# Patient Record
Sex: Female | Born: 1950 | Hispanic: Yes | Marital: Married | Smoking: Never smoker
Health system: Southern US, Community
[De-identification: ages and names within clinical notes are randomized; demographics above are authoritative.]

## PROBLEM LIST (undated history)

## (undated) DIAGNOSIS — E785 Hyperlipidemia, unspecified: Secondary | ICD-10-CM

## (undated) DIAGNOSIS — I8393 Asymptomatic varicose veins of bilateral lower extremities: Secondary | ICD-10-CM

## (undated) DIAGNOSIS — F419 Anxiety disorder, unspecified: Secondary | ICD-10-CM

## (undated) DIAGNOSIS — M8580 Other specified disorders of bone density and structure, unspecified site: Secondary | ICD-10-CM

## (undated) DIAGNOSIS — T7840XA Allergy, unspecified, initial encounter: Secondary | ICD-10-CM

## (undated) HISTORY — DX: Asymptomatic varicose veins of bilateral lower extremities: I83.93

## (undated) HISTORY — DX: Hyperlipidemia, unspecified: E78.5

## (undated) HISTORY — DX: Anxiety disorder, unspecified: F41.9

## (undated) HISTORY — DX: Allergy, unspecified, initial encounter: T78.40XA

## (undated) HISTORY — PX: HERNIA REPAIR: SHX51

## (undated) HISTORY — DX: Other specified disorders of bone density and structure, unspecified site: M85.80

---

## 1978-09-02 HISTORY — PX: INGUINAL HERNIA REPAIR: SUR1180

## 2011-02-22 ENCOUNTER — Emergency Department (HOSPITAL_COMMUNITY)
Admission: EM | Admit: 2011-02-22 | Discharge: 2011-02-23 | Disposition: A | Discharge status: Home / Self Care | Payer: PRIVATE HEALTH INSURANCE | Attending: Emergency Medicine | Admitting: Emergency Medicine

## 2011-02-22 ENCOUNTER — Emergency Department (HOSPITAL_COMMUNITY): Payer: PRIVATE HEALTH INSURANCE

## 2011-02-22 DIAGNOSIS — R109 Unspecified abdominal pain: Secondary | ICD-10-CM | POA: Insufficient documentation

## 2011-02-22 DIAGNOSIS — E039 Hypothyroidism, unspecified: Secondary | ICD-10-CM | POA: Insufficient documentation

## 2011-02-22 DIAGNOSIS — Z79899 Other long term (current) drug therapy: Secondary | ICD-10-CM | POA: Insufficient documentation

## 2011-02-22 DIAGNOSIS — K4090 Unilateral inguinal hernia, without obstruction or gangrene, not specified as recurrent: Secondary | ICD-10-CM | POA: Insufficient documentation

## 2011-02-22 LAB — DIFFERENTIAL
Basophils Absolute: 0 10*3/uL (ref 0.0–0.1)
Eosinophils Absolute: 0.1 10*3/uL (ref 0.0–0.7)
Eosinophils Relative: 1 % (ref 0–5)
Lymphocytes Relative: 29 % (ref 12–46)

## 2011-02-22 LAB — COMPREHENSIVE METABOLIC PANEL
ALT: 39 U/L — ABNORMAL HIGH (ref 0–35)
AST: 25 U/L (ref 0–37)
Albumin: 4.4 g/dL (ref 3.5–5.2)
Alkaline Phosphatase: 63 U/L (ref 39–117)
Chloride: 102 mEq/L (ref 96–112)
Potassium: 4 mEq/L (ref 3.5–5.1)
Sodium: 138 mEq/L (ref 135–145)
Total Bilirubin: 0.3 mg/dL (ref 0.3–1.2)

## 2011-02-22 LAB — CBC
HCT: 39.9 % (ref 36.0–46.0)
Platelets: 253 10*3/uL (ref 150–400)
RDW: 12 % (ref 11.5–15.5)
WBC: 6 10*3/uL (ref 4.0–10.5)

## 2011-02-23 LAB — URINALYSIS, ROUTINE W REFLEX MICROSCOPIC
Glucose, UA: NEGATIVE mg/dL
Hgb urine dipstick: NEGATIVE
Ketones, ur: NEGATIVE mg/dL
Protein, ur: NEGATIVE mg/dL

## 2019-03-31 ENCOUNTER — Other Ambulatory Visit: Payer: Self-pay | Admitting: Physician Assistant

## 2019-03-31 DIAGNOSIS — Z1231 Encounter for screening mammogram for malignant neoplasm of breast: Secondary | ICD-10-CM

## 2019-04-08 ENCOUNTER — Ambulatory Visit
Admission: RE | Admit: 2019-04-08 | Discharge: 2019-04-08 | Disposition: A | Discharge status: Home / Self Care | Payer: PRIVATE HEALTH INSURANCE | Source: Ambulatory Visit | Attending: Physician Assistant | Admitting: Physician Assistant

## 2019-04-08 ENCOUNTER — Other Ambulatory Visit: Payer: Self-pay

## 2019-04-08 DIAGNOSIS — Z1231 Encounter for screening mammogram for malignant neoplasm of breast: Secondary | ICD-10-CM

## 2020-04-07 ENCOUNTER — Other Ambulatory Visit: Payer: Self-pay | Admitting: Physician Assistant

## 2020-04-07 DIAGNOSIS — Z1231 Encounter for screening mammogram for malignant neoplasm of breast: Secondary | ICD-10-CM

## 2020-04-10 ENCOUNTER — Ambulatory Visit
Admission: RE | Admit: 2020-04-10 | Discharge: 2020-04-10 | Disposition: A | Discharge status: Home / Self Care | Payer: PRIVATE HEALTH INSURANCE | Source: Ambulatory Visit | Attending: Physician Assistant | Admitting: Physician Assistant

## 2020-04-10 ENCOUNTER — Other Ambulatory Visit: Payer: Self-pay

## 2020-04-10 DIAGNOSIS — Z1231 Encounter for screening mammogram for malignant neoplasm of breast: Secondary | ICD-10-CM

## 2020-06-28 ENCOUNTER — Ambulatory Visit (HOSPITAL_COMMUNITY)
Admission: RE | Admit: 2020-06-28 | Discharge: 2020-06-28 | Disposition: A | Discharge status: Home / Self Care | Payer: Medicare Other | Source: Ambulatory Visit | Attending: Vascular Surgery | Admitting: Vascular Surgery

## 2020-06-28 ENCOUNTER — Encounter: Payer: Self-pay | Admitting: Vascular Surgery

## 2020-06-28 ENCOUNTER — Other Ambulatory Visit (HOSPITAL_COMMUNITY): Payer: Self-pay | Admitting: Vascular Surgery

## 2020-06-28 ENCOUNTER — Ambulatory Visit (INDEPENDENT_AMBULATORY_CARE_PROVIDER_SITE_OTHER): Payer: Medicare Other | Admitting: Vascular Surgery

## 2020-06-28 ENCOUNTER — Other Ambulatory Visit: Payer: Self-pay

## 2020-06-28 VITALS — Resp 16 | Ht 61.0 in | Wt 150.0 lb

## 2020-06-28 DIAGNOSIS — I8393 Asymptomatic varicose veins of bilateral lower extremities: Secondary | ICD-10-CM

## 2020-06-28 DIAGNOSIS — I83819 Varicose veins of unspecified lower extremities with pain: Secondary | ICD-10-CM | POA: Insufficient documentation

## 2020-06-28 DIAGNOSIS — I83813 Varicose veins of bilateral lower extremities with pain: Secondary | ICD-10-CM | POA: Diagnosis not present

## 2020-06-28 NOTE — Progress Notes (Signed)
Referring Physician: Luciano Cutter  Patient name: Bianca Sanchez MRN: [id number] DOB: Jan 25, 1951 Sex: female  REASON FOR CONSULT: Right leg pain with varicose veins  HPI: Bianca Sanchez is a 69 y.o. female, who had a fairly acute episode of pain in her right leg just below and medial to her right knee.  This did not last very long.  It was approximately 4 months ago.  She has not had any pain since.  She does not really describe swelling of the lower extremities.  She does sometimes develop some fullness and aching at the end of the day.  She has never had a DVT.  She did fracture her right ankle in the past and occasionally gets some swelling around this.  She has no family history of varicose veins.    Past Medical History:  Diagnosis Date  . Anxiety   . Hyperlipemia   . Osteopenia   . Varicose veins of both lower extremities    Past Surgical History:  Procedure Laterality Date  . HERNIA REPAIR      Family History  Problem Relation Age of Onset  . Breast cancer Neg Hx     SOCIAL HISTORY: Social History   Socioeconomic History  . Marital status: Married    Spouse name: Not on file  . Number of children: Not on file  . Years of education: Not on file  . Highest education level: Not on file  Occupational History  . Not on file  Tobacco Use  . Smoking status: Never Smoker  . Smokeless tobacco: Never Used  Substance and Sexual Activity  . Alcohol use: Never  . Drug use: Never  . Sexual activity: Not on file  Other Topics Concern  . Not on file  Social History Narrative  . Not on file   Social Determinants of Health   Financial Resource Strain:   . Difficulty of Paying Living Expenses: Not on file  Food Insecurity:   . Worried About Programme researcher, broadcasting/film/video in the Last Year: Not on file  . Ran Out of Food in the Last Year: Not on file  Transportation Needs:   . Lack of Transportation (Medical): Not on file  . Lack of Transportation (Non-Medical): Not on file   Physical Activity:   . Days of Exercise per Week: Not on file  . Minutes of Exercise per Session: Not on file  Stress:   . Feeling of Stress : Not on file  Social Connections:   . Frequency of Communication with Friends and Family: Not on file  . Frequency of Social Gatherings with Friends and Family: Not on file  . Attends Religious Services: Not on file  . Active Member of Clubs or Organizations: Not on file  . Attends Banker Meetings: Not on file  . Marital Status: Not on file  Intimate Partner Violence:   . Fear of Current or Ex-Partner: Not on file  . Emotionally Abused: Not on file  . Physically Abused: Not on file  . Sexually Abused: Not on file    Allergies  Allergen Reactions  . Ambien [Zolpidem]   . Aspirin   . Penicillins     Current Outpatient Medications  Medication Sig Dispense Refill  . Cyanocobalamin (VITAMIN B12 PO) Take by mouth.     No current facility-administered medications for this visit.    ROS:   General:  No weight loss, Fever, chills  HEENT: No recent headaches, no nasal bleeding, no visual changes,  no sore throat  Neurologic: No dizziness, blackouts, seizures. No recent symptoms of stroke or mini- stroke. No recent episodes of slurred speech, or temporary blindness.  Cardiac: No recent episodes of chest pain/pressure, no shortness of breath at rest.  No shortness of breath with exertion.  Denies history of atrial fibrillation or irregular heartbeat  Vascular: No history of rest pain in feet.  No history of claudication.  No history of non-healing ulcer, No history of DVT   Pulmonary: No home oxygen, no productive cough, no hemoptysis,  No asthma or wheezing  Musculoskeletal:  [ ]  Arthritis, [ ]  Low back pain,  [ ]  Joint pain  Hematologic:No history of hypercoagulable state.  No history of easy bleeding.  No history of anemia  Gastrointestinal: No hematochezia or melena,  No gastroesophageal reflux, no trouble  swallowing  Urinary: [ ]  chronic Kidney disease, [ ]  on HD - [ ]  MWF or [ ]  TTHS, [ ]  Burning with urination, [ ]  Frequent urination, [ ]  Difficulty urinating;   Skin: No rashes  Psychological: No history of anxiety,  No history of depression   Physical Examination  Vitals:   06/28/20 1518  Resp: 16  Weight: 150 lb (68 kg)  Height: 5\' 1"  (1.549 m)    Body mass index is 28.34 kg/m.  General:  Alert and oriented, no acute distress HEENT: Normal Neck: No JVD Cardiac: Regular Rate and Rhythm Skin: No rash, few scattered spider type varicosities bilateral lower extremities primarily medial calf Extremity Pulses:  2+dorsalis pedis, posterior tibial pulses bilaterally Musculoskeletal: No deformity or edema  Neurologic: Upper and lower extremity motor 5/5 and symmetric  DATA: Greater saphenous is less than 3 mm diameter although there is some reflux.  Right greater saphenous is less than 4 mm diameter again some mild reflux.  I reviewed and interpreted the study.  There was no evidence of DVT.  There was mild deep vein reflux.  ASSESSMENT: Patient was episode of pain in the right knee several months ago.  She has not had pain since that time.  She does occasionally have some fullness heaviness and aching in her legs at the end of the day.  She does have mild venous reflux on exam.  Patient was reassured today that she has normal arterial circulation is not at risk of losing toe foot or leg.  Also discussed with her that she does have mild reflux in her lower extremities but that it is not significant enough to consider an intervention at this point.  She was also reassured that she did not have a DVT.   PLAN: Patient was given a prescription today for lower extremity compression stockings 20 to 30 mmHg knee-high.  She will wear these during the day.  She will also elevate her legs at the end of the day.  The patient prefers not to take any anti-inflammatory medications as she has  apparently several drug allergies which cause anaphylaxis.  We will follow up with me on as-needed basis.   , MD [facility] [place] Office: [phone]

## 2020-08-14 ENCOUNTER — Encounter (HOSPITAL_COMMUNITY): Payer: Medicare Other

## 2021-01-31 ENCOUNTER — Ambulatory Visit: Payer: Medicare Other | Admitting: Dermatology

## 2021-02-15 ENCOUNTER — Ambulatory Visit: Payer: Medicare Other | Admitting: Dermatology

## 2021-03-01 ENCOUNTER — Other Ambulatory Visit: Payer: Self-pay | Admitting: Adult Medicine

## 2021-03-01 DIAGNOSIS — M549 Dorsalgia, unspecified: Secondary | ICD-10-CM

## 2021-03-09 ENCOUNTER — Other Ambulatory Visit: Payer: Self-pay | Admitting: Adult Medicine

## 2021-03-09 ENCOUNTER — Other Ambulatory Visit: Payer: Self-pay | Admitting: *Deleted

## 2021-03-09 DIAGNOSIS — Z1231 Encounter for screening mammogram for malignant neoplasm of breast: Secondary | ICD-10-CM

## 2021-03-22 ENCOUNTER — Ambulatory Visit
Admission: RE | Admit: 2021-03-22 | Discharge: 2021-03-22 | Disposition: A | Discharge status: Home / Self Care | Payer: Medicare Other | Source: Ambulatory Visit | Attending: Adult Medicine | Admitting: Adult Medicine

## 2021-03-22 ENCOUNTER — Other Ambulatory Visit: Payer: Self-pay

## 2021-03-22 DIAGNOSIS — M549 Dorsalgia, unspecified: Secondary | ICD-10-CM

## 2021-03-22 DIAGNOSIS — G8929 Other chronic pain: Secondary | ICD-10-CM

## 2021-04-11 ENCOUNTER — Other Ambulatory Visit: Payer: Self-pay

## 2021-04-11 ENCOUNTER — Ambulatory Visit
Admission: RE | Admit: 2021-04-11 | Discharge: 2021-04-11 | Disposition: A | Discharge status: Home / Self Care | Payer: Medicare Other | Source: Ambulatory Visit | Attending: Adult Medicine | Admitting: Adult Medicine

## 2021-04-11 DIAGNOSIS — Z1231 Encounter for screening mammogram for malignant neoplasm of breast: Secondary | ICD-10-CM

## 2021-05-03 ENCOUNTER — Ambulatory Visit: Payer: Medicare Other

## 2022-01-21 ENCOUNTER — Other Ambulatory Visit: Payer: Self-pay | Admitting: Adult Medicine

## 2022-01-21 DIAGNOSIS — Z1231 Encounter for screening mammogram for malignant neoplasm of breast: Secondary | ICD-10-CM

## 2022-04-15 ENCOUNTER — Ambulatory Visit
Admission: RE | Admit: 2022-04-15 | Discharge: 2022-04-15 | Disposition: A | Discharge status: Home / Self Care | Payer: Medicare Other | Source: Ambulatory Visit | Attending: Adult Medicine | Admitting: Adult Medicine

## 2022-04-15 DIAGNOSIS — Z1231 Encounter for screening mammogram for malignant neoplasm of breast: Secondary | ICD-10-CM

## 2022-05-09 ENCOUNTER — Other Ambulatory Visit: Payer: Self-pay | Admitting: *Deleted

## 2022-05-09 DIAGNOSIS — I83813 Varicose veins of bilateral lower extremities with pain: Secondary | ICD-10-CM

## 2022-05-16 ENCOUNTER — Ambulatory Visit (HOSPITAL_COMMUNITY)
Admission: RE | Admit: 2022-05-16 | Discharge: 2022-05-16 | Disposition: A | Discharge status: Home / Self Care | Payer: Medicare Other | Source: Ambulatory Visit | Attending: Vascular Surgery | Admitting: Vascular Surgery

## 2022-05-16 DIAGNOSIS — I83813 Varicose veins of bilateral lower extremities with pain: Secondary | ICD-10-CM | POA: Insufficient documentation

## 2022-05-22 NOTE — Progress Notes (Unsigned)
[facility]           [facility] [place]  History and Physical   Bianca Sanchez is a 71 y.o. female who was seen in 2021 by Dr. Oneida Alar for right leg pain.  She was having occasional fullness and heaviness with aching in her legs at the end of the day.  She had mild venous reflux on exam but not a candidate for laser ablation.  She had normal circulation.  She did not have a DVT.    She comes in today for further evaluation of the left leg.  Translator services on the pad is used.  She states that she has tiredness and achiness that is worse at the end of the day.  She states that her legs feel restless at night and if she elevates them, it does help.  She did try to wear knee high compression after the recommendations at her last visit, but states the top of the compression was too tight and was painful.  She has not tried thigh high compression.  She denies any hx of DVT or venous procedures on her legs.  She does not have any skin changes in the lower legs.   The pt is not on a statin for cholesterol management.  The pt is not on a daily aspirin.   Other AC:  none The pt is not on medication for hypertension.   The pt is not diabetic.   Tobacco hx:  never   Past Medical History:  Diagnosis Date   Anxiety    Hyperlipemia    Osteopenia    Varicose veins of both lower extremities     Past Surgical History:  Procedure Laterality Date   HERNIA REPAIR      Social History   Socioeconomic History   Marital status: Married    Spouse name: Not on file   Number of children: Not on file   Years of education: Not on file   Highest education level: Not on file  Occupational History   Not on file  Tobacco Use   Smoking status: Never   Smokeless tobacco: Never  Substance and Sexual Activity   Alcohol use: Never   Drug use: Never   Sexual activity: Not on file  Other Topics Concern   Not on file  Social History Narrative   Not on file   Social  Determinants of Health   Financial Resource Strain: Not on file  Food Insecurity: Not on file  Transportation Needs: Not on file  Physical Activity: Not on file  Stress: Not on file  Social Connections: Not on file  Intimate Partner Violence: Not on file    Family History  Problem Relation Age of Onset   Breast cancer Neg Hx     Current Outpatient Medications  Medication Sig Dispense Refill   Cyanocobalamin (VITAMIN B12 PO) Take by mouth.     No current facility-administered medications for this visit.    Allergies  Allergen Reactions   Ambien [Zolpidem]    Aspirin    Penicillins     REVIEW OF SYSTEMS:   [X]  denotes positive finding, [ ]  denotes negative finding Cardiac  Comments:  Chest pain or chest pressure:    Shortness of breath upon exertion:    Short of breath when lying flat:    Irregular heart rhythm:        Vascular    Pain in calf, thigh,  or hip brought on by ambulation:    Pain in feet at night that wakes you up from your sleep:     Blood clot in your veins:    Leg swelling:  x See HPI      Pulmonary    Oxygen at home:    Productive cough:     Wheezing:         Neurologic    Sudden weakness in arms or legs:     Sudden numbness in arms or legs:     Sudden onset of difficulty speaking or slurred speech:    Temporary loss of vision in one eye:     Problems with dizziness:         Gastrointestinal    Blood in stool:     Vomited blood:         Genitourinary    Burning when urinating:     Blood in urine:        Psychiatric    Major depression:         Hematologic    Bleeding problems:    Problems with blood clotting too easily:        Skin    Rashes or ulcers:        Constitutional    Fever or chills:      PHYSICAL EXAMINATION:  Today's Vitals   05/23/22 1059  BP: 139/88  Pulse: 75  Resp: 20  Temp: 98.4 F (36.9 C)  TempSrc: Temporal  SpO2: 100%  Weight: 157 lb 12.8 oz (71.6 kg)  Height: 5\' 1"  (1.549 m)  PainSc: 2     Body mass index is 29.82 kg/m.   General:  WDWN in NAD; vital signs documented above Gait: Not observed HENT: WNL, normocephalic Pulmonary: normal non-labored breathing without wheezing Cardiac: regular HR; without carotid bruits Skin: without rashes Vascular Exam/Pulses:  Right Left  Radial 2+ (normal) 2+ (normal)  DP 2+ (normal) 2+ (normal)  PT 2+ (normal) 2+ (normal)   Extremities: minimal swelling BLE; she does have minimal spider veins on the medial aspect of the left lower leg  Neurologic: A&O X 3;  moving all extremities equally Psychiatric:  The pt has Normal affect.   Non-Invasive Vascular Imaging:   Venous duplex on 05/16/2022: +--------------+---------+------+-----------+------------+--------+  LEFT          Reflux NoRefluxReflux TimeDiameter cmsComments                          Yes                                   +--------------+---------+------+-----------+------------+--------+  CFV           no                                              +--------------+---------+------+-----------+------------+--------+  FV mid        no                                              +--------------+---------+------+-----------+------------+--------+  Popliteal     no                                              +--------------+---------+------+-----------+------------+--------+  GSV at [facility]              yes    >500 ms      0.47              +--------------+---------+------+-----------+------------+--------+  GSV prox thighno                            0.20              +--------------+---------+------+-----------+------------+--------+  GSV mid thigh no                            0.26              +--------------+---------+------+-----------+------------+--------+  GSV dist thighno                            0.24              +--------------+---------+------+-----------+------------+--------+  GSV at knee    no                            0.22              +--------------+---------+------+-----------+------------+--------+  SSV Pop Fossa no                            0.17              +--------------+---------+------+-----------+------------+--------+   Summary:  Left:  - No evidence of deep vein thrombosis from the common femoral through the popliteal veins.  - No evidence of superficial venous thrombosis.  - The deep venous system is competent.  - The great and small saphenous veins are competent.    Bianca Sanchez is a 71 y.o. female who presents with: LLE achiness, swelling and heaviness    -pt has easily palpable pedal pulses -pt does not have evidence of DVT.  Pt does have venous reflux in the GSV at the [facility] but no other reflux is present on duplex.  Discussed that the small spider vein on the LLE could be injected but she is not interested in this.   -discussed with pt about wearing knee thigh high compression since she did not tolerate the knee high and she was willing to try this.   -discussed the importance of leg elevation and how to elevate properly - pt is advised to elevate their legs and a diagram is given to them to demonstrate for pt to lay flat on their back with knees elevated and slightly bent with their feet higher than their knees, which puts their feet higher than their heart for 15 minutes per day.  If pt cannot lay flat, advised to lay as flat as possible.  -pt is advised to continue as much walking as possible and avoid sitting or standing for long periods of time.  -discussed importance of weight loss and exercise and that water aerobics would also be beneficial.  -handout in Spanish with recommendations given to pt -pt will f/u as needed.   Doreatha Massed, [facility] [phone]  Clinic MD:  Edilia Bo

## 2022-05-23 ENCOUNTER — Encounter: Payer: Self-pay | Admitting: Physician Assistant

## 2022-05-23 ENCOUNTER — Ambulatory Visit: Payer: Medicare Other | Admitting: Physician Assistant

## 2022-05-23 VITALS — BP 139/88 | HR 75 | Temp 98.4°F | Resp 20 | Ht 61.0 in | Wt 157.8 lb

## 2022-05-23 DIAGNOSIS — M7989 Other specified soft tissue disorders: Secondary | ICD-10-CM

## 2022-07-24 IMAGING — MG MM DIGITAL SCREENING BILAT W/ TOMO AND CAD
8 series · 8 of 24 positions shown · non-contrast
Comparison: Previous exam(s).

CLINICAL DATA: Screening.

EXAM:
DIGITAL SCREENING BILATERAL MAMMOGRAM WITH TOMOSYNTHESIS AND CAD
TECHNIQUE: Bilateral screening digital craniocaudal and mediolateral oblique
mammograms were obtained. Bilateral screening digital breast
tomosynthesis was performed. The images were evaluated with
computer-aided detection.

[R MLO synth-2D]
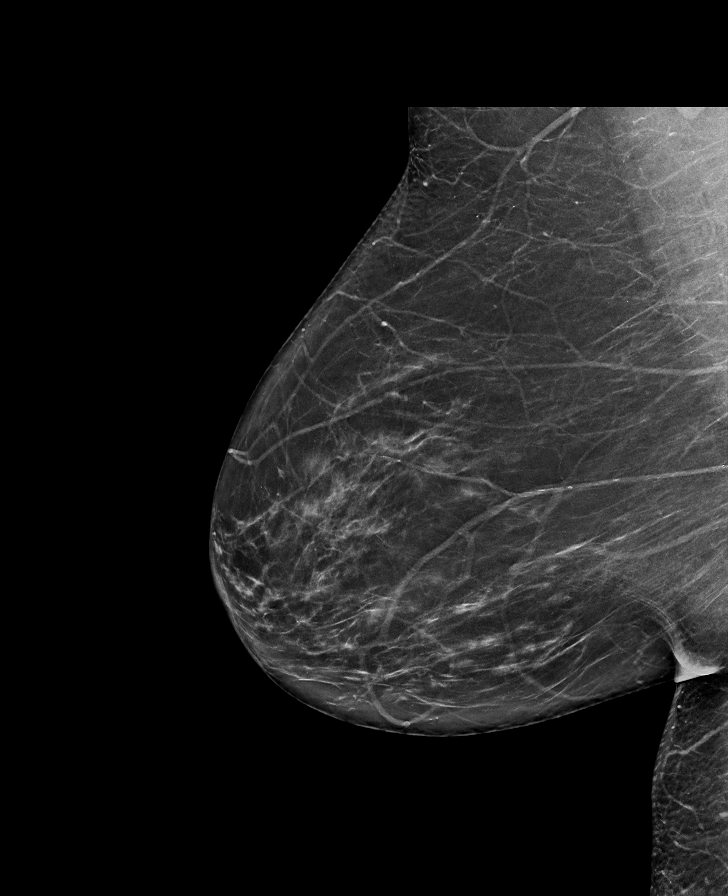

[L CC synth-2D]
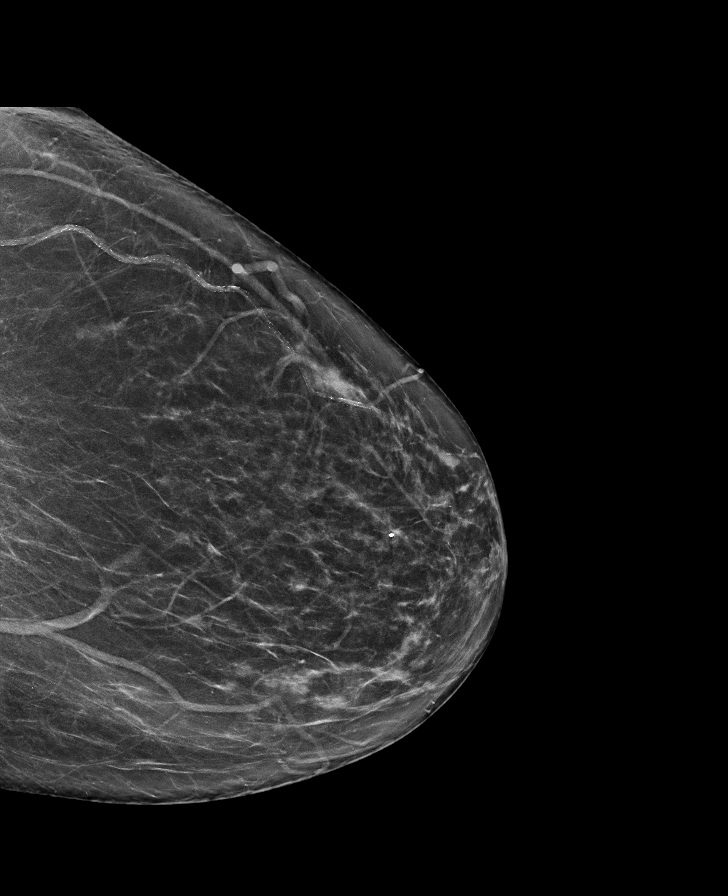

[R CC synth-2D]
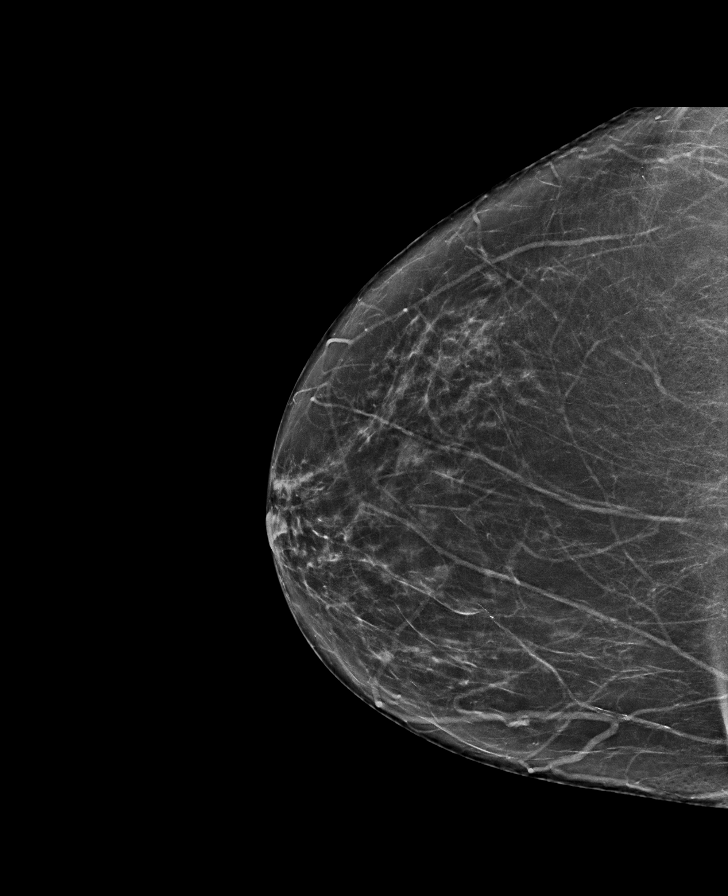

[L MLO synth-2D]
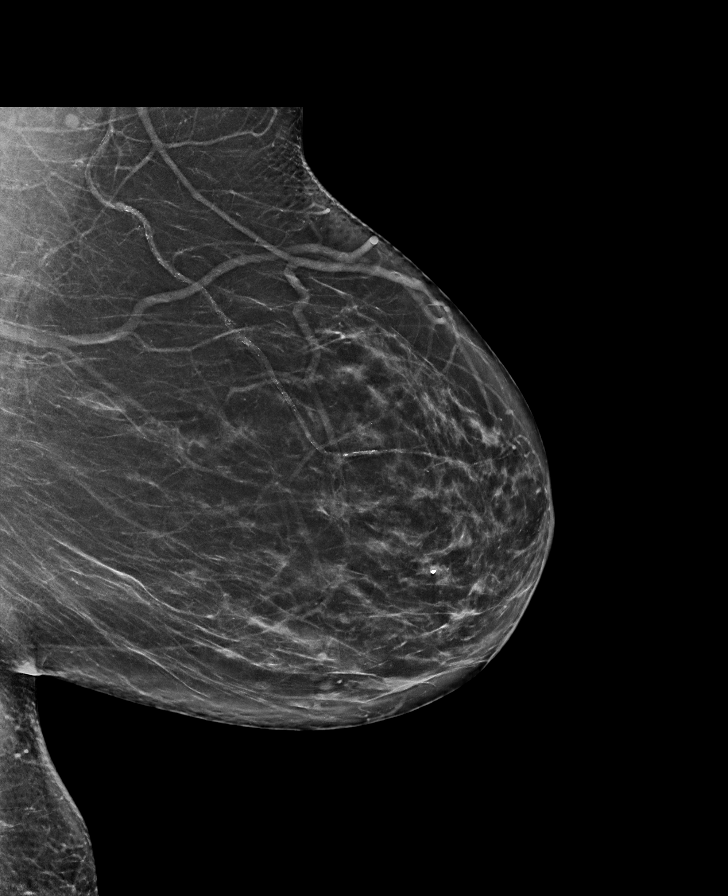

[R CC tomo · tomo slice 35/69.0]
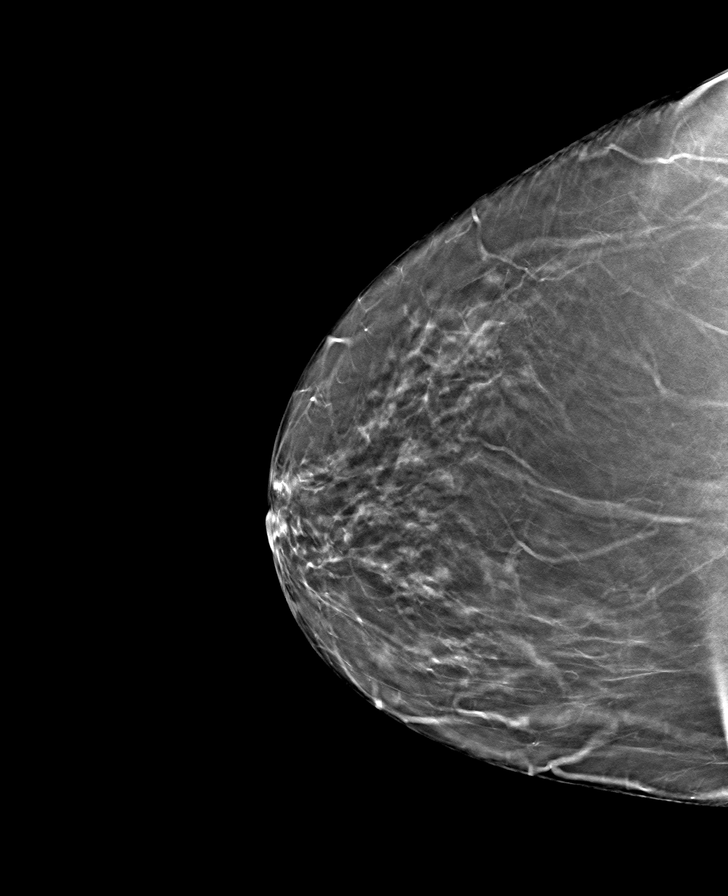

[R MLO tomo · tomo slice 38/75.0]
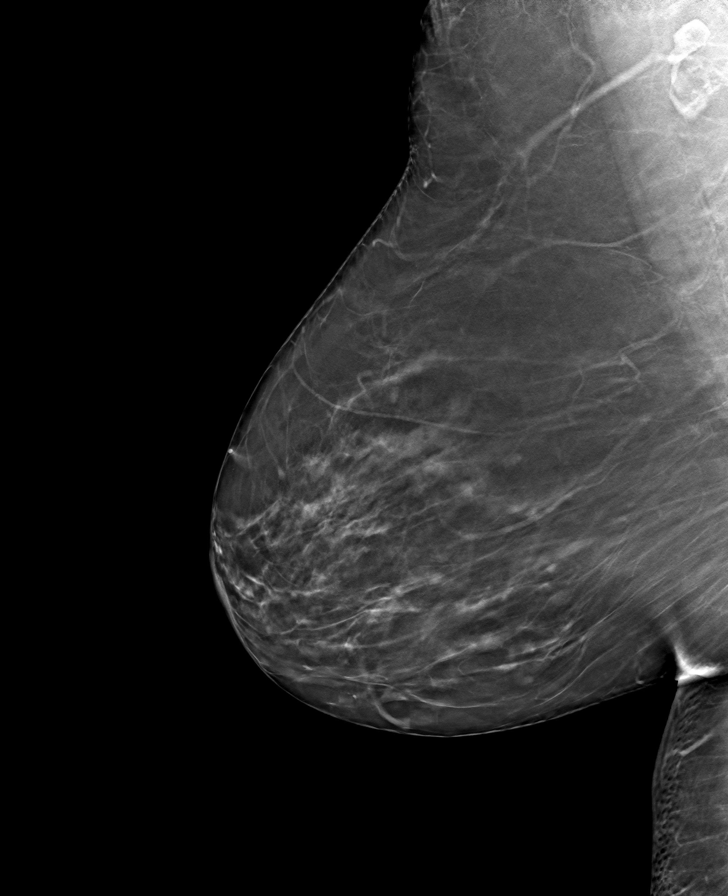

[L MLO tomo · tomo slice 37/74.0]
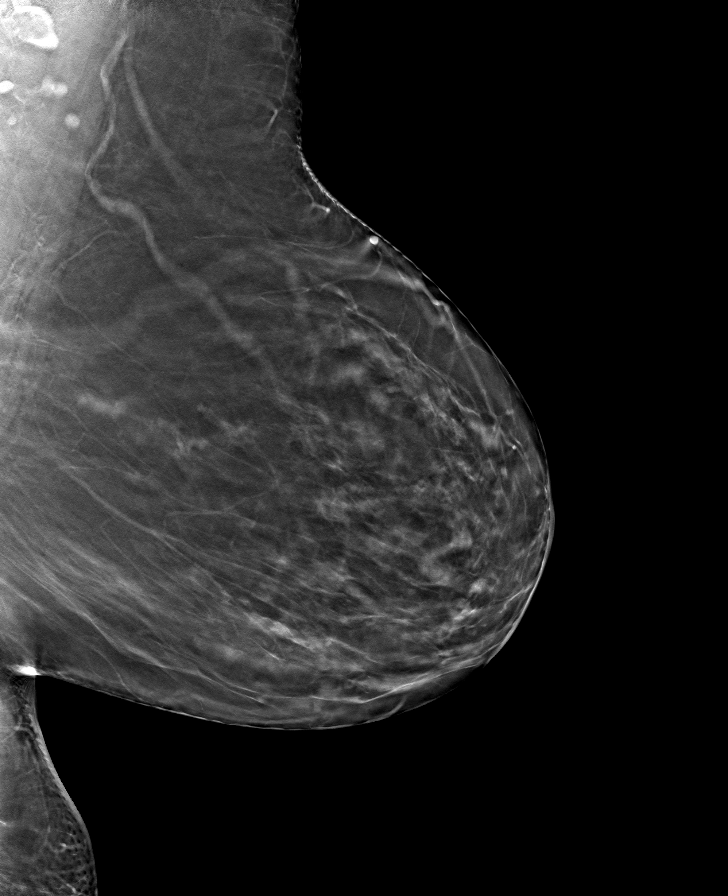

[L CC tomo · tomo slice 35/70.0]
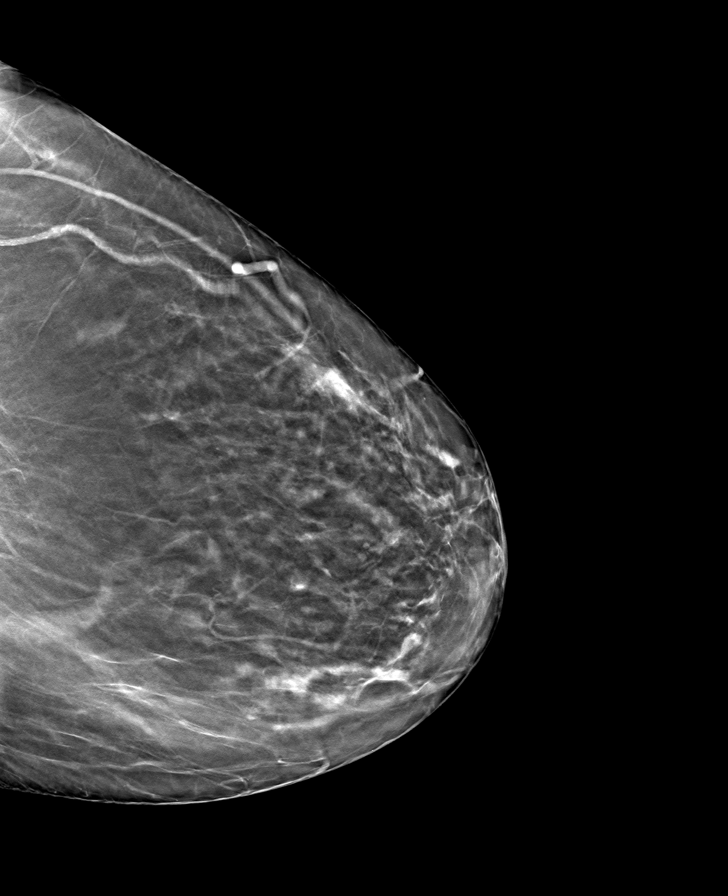

[8 of 24 positions shown; findings below may reference images not displayed]

ACR Breast Density Category b: There are scattered areas of
fibroglandular density.
FINDINGS: There are no findings suspicious for malignancy.
IMPRESSION: No mammographic evidence of malignancy. A result letter of this
screening mammogram will be mailed directly to the patient.

RECOMMENDATION:
Screening mammogram in one year. (Code:51-O-LD2)

BI-RADS CATEGORY  1: Negative.

## 2022-12-16 NOTE — Progress Notes (Unsigned)
HPI: Bianca Sanchez is a 72 y.o. female, who is here today with her husband to establish care.  Former PCP: Dr Thompson Grayer at [facility]. Last preventive routine visit: 01/2022.  She has hx of varicose vein disease, multinodular goiter. She is established with endocrinologist,Dr Gorris, last seen in 07/2021. Planning on arranging follow up appt. She was previously prescribed Synthroid; However, after evaluations by multiple endocrinologists, including sonograms and a biopsy in [place] it was determined she no longer needed the medication.  HTN, listed on meds is Losartan. She reports no longer taking medication for HTN as it has normalized, attributing this change to her diet rather than medication. She monitors her blood pressure daily at home, noting that it tends to increase during doctor visits due to nervousness.  Home BP's 120-130/70-80. She emphasizes her preference for natural supplements over medication. Negative for severe/frequent headache, visual changes, chest pain, dyspnea, palpitation, claudication, focal weakness, or edema.  Lab Results  Component Value Date   CREATININE 0.66 02/22/2011   BUN 15 02/22/2011   NA 138 02/22/2011   K 4.0 02/22/2011   CL 102 02/22/2011   CO2 29 02/22/2011   Lab Results  Component Value Date   ALT 39 (H) 02/22/2011   AST 25 02/22/2011   ALKPHOS 63 02/22/2011   BILITOT 0.3 02/22/2011   Lab Results  Component Value Date   WBC 6.0 02/22/2011   HGB 13.6 02/22/2011   HCT 39.9 02/22/2011   MCV 96.8 02/22/2011   PLT 253 02/22/2011   She conducts her laboratory tests biannually, with the most recent tests done in January/2024 before a trip to [place]  She and her husband prioritize a healthy lifestyle, including diet and exercise. She expresses a strong preference for walking and natural remedies over gym workouts, citing concerns about gym cleanliness and a preference for natural environments.  She mentions  that during the COVID-19 pandemic, she experienced significant stress due to lockdown measures, which affected her sleep.  She discusses her daily health routine, which includes drinking green juice made with a variety of fruits, vegetables, and supplements like turmeric, ginger, and MCT oil. She also takes collagen, apple cider vinegar, and vitamin C, sourcing her supplements from a trusted homeopathic provider.  She has had recurrent episodes of moderate to sever suprapubic pressure, initially thought to be a urinary infection, treated for such about 3 times before she was referred to gynecologist. Diagnosed with genital prolapse, she was reassured but due to persistent concerns and anxiety about possible serious process, she was referred to surgeon, pending appt. States that she is apprehensive about hospital environments and surgical interventions, preferring non-invasive treatments. She mentions a discussion about using a "ring", ?pessary, to manage prolapse but has not pursued this option.  Suprapubic pressure is greatly improved after defecation. Constipation, she takes a senna tea as needed. Linzess 72 mcg daily was recommended, she stopped taking it due to increased discomfort and relies on dietary fiber to manage her bowel movements. She has 2-3 bowel movements daily, a small and occasionally small round hard stool.  She has a history of undergoing colonoscopies, with a polyp found in one examination.  Last colonoscopy in 2017 when she was in [place] and 10 years follow up was recommended.  She has not noted fever, abnormal weight loss, night sweats, changes in bowel habits, melena, blood in the stool.  Review of Systems  Constitutional:  Negative for activity change, appetite change and fatigue.  HENT:  Negative for mouth sores, sore throat and trouble swallowing.   Respiratory:  Negative for cough and wheezing.   Gastrointestinal:  Negative for blood in stool, nausea and vomiting.   Endocrine: Negative for cold intolerance and heat intolerance.  Genitourinary:  Negative for decreased urine volume, dysuria, hematuria, vaginal bleeding and vaginal discharge.  Skin:  Negative for rash.  Neurological:  Negative for syncope and facial asymmetry.  See other pertinent positives and negatives in HPI.  Current Outpatient Medications on File Prior to Visit  Medication Sig Dispense Refill   Cyanocobalamin (VITAMIN B12 PO) Take by mouth.     EPINEPHrine (EPIPEN JR) 0.15 MG/0.3ML injection USE AS NEEDED FOR ANAPHYLAXIS     Omega-3 1000 MG CAPS Take by mouth.     Vitamin D-Vitamin K (DECARA K) 1250-200 MCG CAPS Take by mouth.     No current facility-administered medications on file prior to visit.   Past Medical History:  Diagnosis Date   Anxiety    Hyperlipemia    Osteopenia    Varicose veins of both lower extremities    Allergies  Allergen Reactions   Acetaminophen Anaphylaxis   Ciprofloxacin Anaphylaxis   Ambien [Zolpidem]    Aspirin    Penicillins     Family History  Problem Relation Age of Onset   Breast cancer Neg Hx     Social History   Socioeconomic History   Marital status: Married    Spouse name: Not on file   Number of children: Not on file   Years of education: Not on file   Highest education level: Not on file  Occupational History   Not on file  Tobacco Use   Smoking status: Never    Passive exposure: Never   Smokeless tobacco: Never  Substance and Sexual Activity   Alcohol use: Never   Drug use: Never   Sexual activity: Not on file  Other Topics Concern   Not on file  Social History Narrative   Not on file   Social Determinants of Health   Financial Resource Strain: Not on file  Food Insecurity: Not on file  Transportation Needs: Not on file  Physical Activity: Not on file  Stress: Not on file  Social Connections: Not on file    Vitals:   12/17/22 0758  BP: (!) 136/90  Pulse: 79  SpO2: 96%    Body mass index is 29.69  kg/m.  Physical Exam Vitals and nursing note reviewed.  Constitutional:      General: She is not in acute distress.    Appearance: She is well-developed.  HENT:     Head: Normocephalic and atraumatic.     Mouth/Throat:     Mouth: Mucous membranes are moist.     Pharynx: Oropharynx is clear.  Eyes:     Conjunctiva/sclera: Conjunctivae normal.  Cardiovascular:     Rate and Rhythm: Normal rate and regular rhythm.     Pulses:          Dorsalis pedis pulses are 2+ on the right side and 2+ on the left side.     Heart sounds: No murmur heard. Pulmonary:     Effort: Pulmonary effort is normal. No respiratory distress.     Breath sounds: Normal breath sounds.  Abdominal:     Palpations: Abdomen is soft. There is no hepatomegaly or mass.     Tenderness: There is no abdominal tenderness.  Lymphadenopathy:     Cervical: No cervical adenopathy.  Skin:  General: Skin is warm.     Findings: No erythema or rash.  Neurological:     General: No focal deficit present.     Mental Status: She is alert and oriented to person, place, and time.     Cranial Nerves: No cranial nerve deficit.     Gait: Gait normal.  Psychiatric:        Mood and Affect: Mood and affect normal.   ASSESSMENT AND PLAN:  Ms. Edwin was seen today for establish care.  Diagnoses and all orders for this visit:  Constipation, unspecified constipation type Assessment & Plan: She can continue her senna tea and add MiraLAX every other day at bedtime. Continue adequate fiber and fluid intake. Reporting colonoscopy in 2017 and due in 2027. Will try to obtain copy of report.   Essential (primary) hypertension Assessment & Plan: She is on nonpharmacologic treatment. BP rechecked: 160/100. She is not interested in pharmacologic treatment. Reporting appropriate BP readings at home. We discussed possible complications of elevated BP. Continue monitoring BP regularly.   Female genital prolapse, unspecified  type Assessment & Plan: We discussed diagnosis, prognosis, and treatment options. Avoid constipation/straining. Reviewing note from Dr Ernestina Penna, gyn, she has a rectocele.Pending appointment with surgeon.   Suprapubic abdominal pain Assessment & Plan: We discussed possible etiologies. She reports having some workup that includes gynecology evaluation and [place]. I do not have copy of labs or imaging results at this time. Constipation could be a major contributing factor. Monitor for new symptoms. We do not have FIT cards here in the office, we will mail them to her when available.   I spent a total of 55 minutes in both face to face and non face to face activities for this visit on the date of this encounter. During this time history was obtained and documented, examination was performed, prior labs reviewed, and assessment/plan discussed.  Return in about 6 months (around 06/18/2023) for chronic problems.  Bianca Mruk G. [place], MD  [facility]. [place] office.

## 2022-12-17 ENCOUNTER — Encounter: Payer: Self-pay | Admitting: Family Medicine

## 2022-12-17 ENCOUNTER — Ambulatory Visit (INDEPENDENT_AMBULATORY_CARE_PROVIDER_SITE_OTHER): Payer: Medicare Other | Admitting: Family Medicine

## 2022-12-17 VITALS — BP 136/90 | HR 79 | Ht 61.0 in | Wt 157.1 lb

## 2022-12-17 DIAGNOSIS — R102 Pelvic and perineal pain: Secondary | ICD-10-CM | POA: Diagnosis not present

## 2022-12-17 DIAGNOSIS — I10 Essential (primary) hypertension: Secondary | ICD-10-CM | POA: Diagnosis not present

## 2022-12-17 DIAGNOSIS — N819 Female genital prolapse, unspecified: Secondary | ICD-10-CM

## 2022-12-17 DIAGNOSIS — K5900 Constipation, unspecified: Secondary | ICD-10-CM | POA: Diagnosis not present

## 2022-12-17 DIAGNOSIS — N816 Rectocele: Secondary | ICD-10-CM | POA: Insufficient documentation

## 2022-12-17 NOTE — Assessment & Plan Note (Signed)
We discussed possible etiologies. She reports having some workup that includes gynecology evaluation and [place]. I do not have copy of labs or imaging results. Constipation could be a major contributing factor. Monitor for new symptoms. We do not have FIT cards here in the office, we will mail them to her when available.

## 2022-12-17 NOTE — Assessment & Plan Note (Signed)
She can continue her senna tea and add MiraLAX every other day at bedtime. Continue adequate fiber and fluid intake. Reporting colonoscopy is current.

## 2022-12-17 NOTE — Assessment & Plan Note (Addendum)
We discussed diagnosis, prognosis, and treatment options. Avoid constipation/straining. Reviewing note from Dr Ernestina Penna, gyn, she has a rectocele.Pending appointment with surgeon.

## 2022-12-17 NOTE — Assessment & Plan Note (Addendum)
She is on nonpharmacologic treatment. BP rechecked: 160/100. She is not interested in pharmacologic treatment. Reporting appropriate BP readings at home. We discussed possible complications of elevated BP. Continue monitoring BP regularly.

## 2022-12-17 NOTE — Patient Instructions (Addendum)
A few things to remember from today's visit:  Essential (primary) hypertension  Female genital prolapse, unspecified type  Constipation, unspecified constipation type  Miralax 1/2 of the dose at bedtime every other day. Continue senna every 2 days.

## 2022-12-20 ENCOUNTER — Telehealth: Payer: Self-pay

## 2022-12-20 DIAGNOSIS — K5900 Constipation, unspecified: Secondary | ICD-10-CM

## 2022-12-20 NOTE — Telephone Encounter (Signed)
Mailed to patient

## 2022-12-20 NOTE — Telephone Encounter (Signed)
-----   Message from Betty G [place], MD sent at 12/19/2022 12:19 PM EDT ----- Regarding: FIT card Are we going to get FIT cards any time soon? When we do, please mail a set for Ms. Bianca Sanchez. Thanks, BJ

## 2022-12-27 ENCOUNTER — Other Ambulatory Visit (INDEPENDENT_AMBULATORY_CARE_PROVIDER_SITE_OTHER): Payer: Medicare Other

## 2022-12-27 ENCOUNTER — Telehealth: Payer: Self-pay

## 2022-12-27 DIAGNOSIS — K5900 Constipation, unspecified: Secondary | ICD-10-CM | POA: Diagnosis not present

## 2022-12-27 LAB — FECAL OCCULT BLOOD, IMMUNOCHEMICAL: Fecal Occult Bld: POSITIVE — AB

## 2022-12-27 NOTE — Telephone Encounter (Signed)
Patient's iFob test was positive.

## 2022-12-27 NOTE — Addendum Note (Signed)
Addended by: Kathreen Devoid on: 12/27/2022 04:01 PM   Modules accepted: Orders

## 2022-12-30 NOTE — Telephone Encounter (Signed)
See result note.  

## 2022-12-31 ENCOUNTER — Other Ambulatory Visit: Payer: Self-pay

## 2022-12-31 DIAGNOSIS — R195 Other fecal abnormalities: Secondary | ICD-10-CM

## 2023-01-01 ENCOUNTER — Encounter: Payer: Self-pay | Admitting: Gastroenterology

## 2023-01-16 ENCOUNTER — Telehealth: Payer: Self-pay | Admitting: Family Medicine

## 2023-01-16 NOTE — Telephone Encounter (Signed)
Contacted Bianca Sanchez to schedule their annual wellness visit. Appointment made for 01/21/23.  Rudell Cobb AWV direct phone # [phone]

## 2023-01-21 ENCOUNTER — Ambulatory Visit (INDEPENDENT_AMBULATORY_CARE_PROVIDER_SITE_OTHER): Payer: Medicare Other | Admitting: Family Medicine

## 2023-01-21 ENCOUNTER — Encounter: Payer: Self-pay | Admitting: Family Medicine

## 2023-01-21 VITALS — BP 106/66

## 2023-01-21 DIAGNOSIS — Z0000 Encounter for general adult medical examination without abnormal findings: Secondary | ICD-10-CM | POA: Diagnosis not present

## 2023-01-21 NOTE — Progress Notes (Signed)
PATIENT CHECK-IN and HEALTH RISK ASSESSMENT QUESTIONNAIRE:  -completed by phone/video for upcoming Medicare Preventive Visit  Pre-Visit Check-in: 1)Vitals (height, wt, BP, etc) - record in vitals section for visit on day of visit 2)Review and Update Medications, Allergies PMH, Surgeries, Social history in Epic 3)Hospitalizations in the last year with date/reason? No  4)Review and Update Care Team (patient's specialists) in Epic 5) Complete PHQ9 in Epic  6) Complete Fall Screening in Epic 7)Review all Health Maintenance Due and order under PCP if not done.  8)Medicare Wellness Questionnaire: Answer theses question about your habits: Do you drink alcohol? No If yes, how many drinks do you have a day?N/A Have you ever smoked?No Quit date if applicable? N/A  How many packs a day do/did you smoke? N/A Do you use smokeless tobacco? No Do you use an illicit drugs?No Do you exercises? Yes IF so, what type and how many days/minutes per week?10 minutes per day - reports stays very active, exercises daily and does all of her housework and cooking.  Are you sexually active? No Number of partners? N/A Typical breakfast: Vegetables, Eggs and Peanut Butter Typical lunch: Chicken Typical dinner: Fish Typical snacks: Fruits, Almonds, Nuts  Beverages: Tea  Answer theses question about you: Can you perform most household chores?Yes Do you find it hard to follow a conversation in a noisy room? No Do you often ask people to speak up or repeat themselves? No Do you feel that you have a problem with memory? No Do you balance your checkbook and or bank acounts?Yes Do you feel safe at home? Yes Last dentist visit? 10/2022  Do you need assistance with any of the following: Please note if so   Driving? No  Feeding yourself? No  Getting from bed to chair? No  Getting to the toilet? No  Bathing or showering? No  Dressing yourself? No  Managing money? No  Climbing a flight of stairs No  Preparing  meals? No  Do you have Advanced Directives in place (Living Will, Healthcare Power or Attorney)? No - reports has discussed with all of her family her wishes and everyone is on the same page.    Last eye Exam and location? [facility] 09/2022    Do you currently use prescribed or non-prescribed narcotic or opioid pain medications? No  Do you have a history or close family history of breast, ovarian, tubal or peritoneal cancer or a family member with BRCA (breast cancer susceptibility 1 and 2) gene mutations? No  Nurse/Assistant Credentials/time stamp: MG 4:45 PM   ----------------------------------------------------------------------------------------------------------------------------------------------------------------------------------------------------------------------   MEDICARE ANNUAL PREVENTIVE VISIT WITH PROVIDER: (Welcome to [employer], initial annual wellness or annual wellness exam)  Virtual Visit via Phone Note  I connected with Bianca Sanchez on 01/21/23 by phone and verified that I am speaking with the correct person using two identifiers.  Location patient: home Location provider:work or home office Persons participating in the virtual visit: patient, provider, spanish interpreter  Concerns and/or follow up today: stable - seeing specialist regarding last visit with PC recently. Otherwise nothing new.    See HM section in Epic for other details of completed HM.    ROS: negative for report of fevers, unintentional weight loss, vision changes, vision loss, hearing loss or change, chest pain, sob, falls, bleeding or bruising, thoughts of suicide or self harm, memory loss  Patient-completed extensive health risk assessment - reviewed and discussed with the patient: See Health Risk Assessment completed with patient prior to the visit either above  or in recent phone note. This was reviewed in detailed with the patient today and appropriate recommendations, orders  and referrals were placed as needed per Summary below and patient instructions.   Review of Medical History: -PMH, PSH, Family History and current specialty and care providers reviewed and updated and listed below   Patient Care Team: [place], Betty G, MD as PCP - General (Family Medicine)   Past Medical History:  Diagnosis Date   Anxiety    Hyperlipemia    Osteopenia    Varicose veins of both lower extremities     Past Surgical History:  Procedure Laterality Date   HERNIA REPAIR      Social History   Socioeconomic History   Marital status: Married    Spouse name: Not on file   Number of children: Not on file   Years of education: Not on file   Highest education level: Not on file  Occupational History   Not on file  Tobacco Use   Smoking status: Never    Passive exposure: Never   Smokeless tobacco: Never  Substance and Sexual Activity   Alcohol use: Never   Drug use: Never   Sexual activity: Not on file  Other Topics Concern   Not on file  Social History Narrative   Not on file   Social Determinants of Health   Financial Resource Strain: Low Risk  (01/21/2023)   Overall Financial Resource Strain (CARDIA)    Difficulty of Paying Living Expenses: Not hard at all  Food Insecurity: No Food Insecurity (01/21/2023)   Hunger Vital Sign    Worried About Running Out of Food in the Last Year: Never true    Ran Out of Food in the Last Year: Never true  Transportation Needs: No Transportation Needs (01/21/2023)   PRAPARE - Administrator, Civil Service (Medical): No    Lack of Transportation (Non-Medical): No  Physical Activity: Insufficiently Active (01/21/2023)   Exercise Vital Sign    Days of Exercise per Week: 3 days    Minutes of Exercise per Session: 10 min  Stress: No Stress Concern Present (01/21/2023)   [employer] of Occupational Health - Occupational Stress Questionnaire    Feeling of Stress : Not at all  Social Connections: Moderately  Integrated (01/21/2023)   Social Connection and Isolation Panel [NHANES]    Frequency of Communication with Friends and Family: More than three times a week    Frequency of Social Gatherings with Friends and Family: Never    Attends Religious Services: 1 to 4 times per year    Active Member of [employer] or Organizations: No    Attends Banker Meetings: Never    Marital Status: Married  Catering manager Violence: Not At Risk (01/21/2023)   Humiliation, Afraid, Rape, and Kick questionnaire    Fear of Current or Ex-Partner: No    Emotionally Abused: No    Physically Abused: No    Sexually Abused: No    Family History  Problem Relation Age of Onset   Breast cancer Neg Hx     Current Outpatient Medications on File Prior to Visit  Medication Sig Dispense Refill   Cyanocobalamin (VITAMIN B12 PO) Take by mouth.     EPINEPHrine (EPIPEN JR) 0.15 MG/0.3ML injection USE AS NEEDED FOR ANAPHYLAXIS     Omega-3 1000 MG CAPS Take by mouth.     Vitamin D-Vitamin K (DECARA K) 1250-200 MCG CAPS Take by mouth.     No current  facility-administered medications on file prior to visit.    Allergies  Allergen Reactions   Acetaminophen Anaphylaxis   Ciprofloxacin Anaphylaxis   Ambien [Zolpidem]    Aspirin    Penicillins        Physical Exam Vitals:   01/21/23 1616  BP: 106/66   Estimated body mass index is 29.69 kg/m as calculated from the following:   Height as of 12/17/22: 5\' 1"  (1.549 m).   Weight as of 12/17/22: 157 lb 2 oz (71.3 kg).  EKG (optional): deferred due to virtual visit  GENERAL: alert, oriented, no acute distress detected, full vision exam deferred due to pandemic and/or virtual encounter  PSYCH/NEURO: pleasant and cooperative, no obvious depression or anxiety, speech and thought processing grossly intact, Cognitive function grossly intact  Flowsheet Row Office Visit from 01/21/2023 in [facility] [place]  PHQ-9 Total Score 0            01/21/2023    4:31 PM 12/17/2022    8:04 AM  Depression screen PHQ 2/9  Decreased Interest 0 0  Down, Depressed, Hopeless 0 0  PHQ - 2 Score 0 0  Altered sleeping 0   Tired, decreased energy 0   Change in appetite 0   Feeling bad or failure about yourself  0   Trouble concentrating 0   Moving slowly or fidgety/restless 0   Suicidal thoughts 0   PHQ-9 Score 0   Difficult doing work/chores Not difficult at all        12/17/2022    8:04 AM 01/21/2023    4:31 PM  Fall Risk  Falls in the past year? 0 0  Was there an injury with Fall? 0 0  Fall Risk Category Calculator 0 0  Patient at Risk for Falls Due to Other (Comment) No Fall Risks  Fall risk Follow up Falls evaluation completed Falls evaluation completed     SUMMARY AND PLAN:  Encounter for Medicare annual wellness exam   Discussed applicable health maintenance/preventive health measures and advised and referred or ordered per patient preferences: -she declines all vaccines as reports has had severe reactions to vaccines in the past -reports will be seeing GI soon about brbpr, reports had colonosocpy in her home [place] in [place] and normal. Reports will discuss with GI as likely will be getting.  -declines dexa and hep c for now, reports will discuss with PCP at next visit  Health Maintenance  Topic Date Due   Hepatitis C Screening  Never done   COLONOSCOPY (Pts 45-24yrs Insurance coverage will need to be confirmed)  Never done   DEXA SCAN  Never done   COVID-19 Vaccine (3 - 2023-24 season) 02/06/2023 (Originally 05/03/2022)   Pneumonia Vaccine 58+ Years old (1 of 1 - PCV) 01/21/2024 (Originally 09/22/2016)   Zoster Vaccines- Shingrix (1 of 2) 04/22/2024 (Originally 09/22/2001)   INFLUENZA VACCINE  04/03/2023   Medicare Annual Wellness (AWV)  01/21/2024   MAMMOGRAM  04/15/2024   HPV VACCINES  Aged Out   DTaP/Tdap/Td  Discontinued    Education and counseling on the following was provided based on the above review of  health and a plan/checklist for the patient, along with additional information discussed, was provided for the patient in the patient instructions :  -Advised on importance of completing advanced directives, discussed options for completing and provided information in patient instructions as well, she feels good about her current situation as son is a doctor -Advised and counseled on a healthy lifestyle -  including the importance of a healthy diet, regular physical activity, social connections and stress management. -Reviewed patient's current diet. Advised and counseled on a whole foods based healthy diet. A summary of a healthy diet was provided in the Patient Instructions. Congratulated on healthy diet. -reviewed patient's current physical activity level and discussed exercise guidelines for adults. Discussed community resources and ideas for safe exercise at home to assist in meeting exercise guideline recommendations in a safe and healthy way. Congratulated on staying active -Advise yearly dental visits at minimum and regular eye exams  Follow up: see patient instructions     Patient Instructions  I really enjoyed getting to talk with you today! I am available on Tuesdays and Thursdays for virtual visits if you have any questions or concerns, or if I can be of any further assistance.   CHECKLIST FROM ANNUAL WELLNESS VISIT:  -Follow up (please call to schedule if not scheduled after visit):   -yearly for annual wellness visit with primary care office  Here is a list of your preventive care/health maintenance measures and the plan for each if any are due:  PLAN For any measures below that may be due:   Health Maintenance  Topic Date Due   Hepatitis C Screening  Never done   COLONOSCOPY (Pts 45-66yrs Insurance coverage will need to be confirmed)  Never done   DEXA SCAN  Never done   COVID-19 Vaccine (3 - 2023-24 season) 02/06/2023 (Originally 05/03/2022)   Pneumonia Vaccine 65+ Years  old (1 of 1 - PCV) 01/21/2024 (Originally 09/22/2016)   Zoster Vaccines- Shingrix (1 of 2) 04/22/2024 (Originally 09/22/2001)   INFLUENZA VACCINE  04/03/2023   Medicare Annual Wellness (AWV)  01/21/2024   MAMMOGRAM  04/15/2024   HPV VACCINES  Aged Out   DTaP/Tdap/Td  Discontinued    -See a dentist at least yearly  -Get your eyes checked and then per your eye specialist's recommendations  -Other issues addressed today:   -I have included below further information regarding a healthy whole foods based diet, physical activity guidelines for adults, stress management and opportunities for social connections. I hope you find this information useful.   -----------------------------------------------------------------------------------------------------------------------------------------------------------------------------------------------------------------------------------------------------------  NUTRITION: -eat real food: lots of colorful vegetables (half the plate) and fruits -5-7 servings of vegetables and fruits per day (fresh or steamed is best), exp. 2 servings of vegetables with lunch and dinner and 2 servings of fruit per day. Berries and greens such as kale and collards are great choices.  -consume on a regular basis: whole grains (make sure first ingredient on label contains the word "whole"), fresh fruits, fish, nuts, seeds, healthy oils (such as olive oil, avocado oil, grape seed oil) -may eat small amounts of dairy and lean meat on occasion, but avoid processed meats such as ham, bacon, lunch meat, etc. -drink water -try to avoid fast food and pre-packaged foods, processed meat -most experts advise limiting sodium to < 2300mg  per day, should limit further is any chronic conditions such as high blood pressure, heart disease, diabetes, etc. The American Heart Association advised that < 1500mg  is is ideal -try to avoid foods that contain any ingredients with names you do not  recognize  -try to avoid sugar/sweets (except for the natural sugar that occurs in fresh fruit) -try to avoid sweet drinks -try to avoid white rice, white bread, pasta (unless whole grain), white or yellow potatoes  EXERCISE GUIDELINES FOR ADULTS: -if you wish to increase your physical activity, do so gradually and with the  approval of your doctor -STOP and seek medical care immediately if you have any chest pain, chest discomfort or trouble breathing when starting or increasing exercise  -move and stretch your body, legs, feet and arms when sitting for long periods -Physical activity guidelines for optimal health in adults: -least 150 minutes per week of aerobic exercise (can talk, but not sing) once approved by your doctor, 20-30 minutes of sustained activity or two 10 minute episodes of sustained activity every day.  -resistance training at least 2 days per week if approved by your doctor -balance exercises 3+ days per week:   Stand somewhere where you have something sturdy to hold onto if you lose balance.    1) lift up on toes, start with 5x per day and work up to 20x   2) stand and lift on leg straight out to the side so that foot is a few inches of the floor, start with 5x each side and work up to 20x each side   3) stand on one foot, start with 5 seconds each side and work up to 20 seconds on each side  If you need ideas or help with getting more active:  -Silver sneakers https://tools.silversneakers.com  -Walk with a Doc: http://www.duncan-williams.com/  -try to include resistance (weight lifting/strength building) and balance exercises twice per week: or the following link for ideas: http://castillo-powell.com/  BuyDucts.dk  STRESS MANAGEMENT: -can try meditating, or just sitting quietly with deep breathing while intentionally relaxing all parts of your body for 5 minutes daily -if you need  further help with stress, anxiety or depression please follow up with your primary doctor or contact the wonderful folks at [facility]: [phone]  SOCIAL CONNECTIONS: -options in [place] if you wish to engage in more social and exercise related activities:  -Silver sneakers https://tools.silversneakers.com  -Walk with a Doc: http://www.duncan-williams.com/  -Check out the [facility] Active Adults 50+ section on the [place] of [employer] (hiking clubs, book clubs, cards and games, chess, exercise classes, aquatic classes and much more) - see the website for details: https://www.[place].gov/departments/parks-recreation/active-adults50  -YouTube has lots of exercise videos for different ages and abilities as well  -[facility] (a variety of indoor and outdoor inperson activities for adults). [phone]. [address]  -Virtual Online Classes (a variety of topics): see seniorplanet.org or call [phone]  -consider volunteering at a school, hospice center, church, senior center or elsewhere           Terressa Koyanagi, DO

## 2023-01-21 NOTE — Patient Instructions (Signed)
I really enjoyed getting to talk with you today! I am available on Tuesdays and Thursdays for virtual visits if you have any questions or concerns, or if I can be of any further assistance.   CHECKLIST FROM ANNUAL WELLNESS VISIT:  -Follow up (please call to schedule if not scheduled after visit):   -yearly for annual wellness visit with primary care office  Here is a list of your preventive care/health maintenance measures and the plan for each if any are due:  PLAN For any measures below that may be due:   Health Maintenance  Topic Date Due   Hepatitis C Screening  Never done   COLONOSCOPY (Pts 45-62yrs Insurance coverage will need to be confirmed)  Never done   DEXA SCAN  Never done   COVID-19 Vaccine (3 - 2023-24 season) 02/06/2023 (Originally 05/03/2022)   Pneumonia Vaccine 70+ Years old (1 of 1 - PCV) 01/21/2024 (Originally 09/22/2016)   Zoster Vaccines- Shingrix (1 of 2) 04/22/2024 (Originally 09/22/2001)   INFLUENZA VACCINE  04/03/2023   Medicare Annual Wellness (AWV)  01/21/2024   MAMMOGRAM  04/15/2024   HPV VACCINES  Aged Out   DTaP/Tdap/Td  Discontinued    -See a dentist at least yearly  -Get your eyes checked and then per your eye specialist's recommendations  -Other issues addressed today:   -I have included below further information regarding a healthy whole foods based diet, physical activity guidelines for adults, stress management and opportunities for social connections. I hope you find this information useful.   -----------------------------------------------------------------------------------------------------------------------------------------------------------------------------------------------------------------------------------------------------------  NUTRITION: -eat real food: lots of colorful vegetables (half the plate) and fruits -5-7 servings of vegetables and fruits per day (fresh or steamed is best), exp. 2 servings of vegetables with lunch and  dinner and 2 servings of fruit per day. Berries and greens such as kale and collards are great choices.  -consume on a regular basis: whole grains (make sure first ingredient on label contains the word "whole"), fresh fruits, fish, nuts, seeds, healthy oils (such as olive oil, avocado oil, grape seed oil) -may eat small amounts of dairy and lean meat on occasion, but avoid processed meats such as ham, bacon, lunch meat, etc. -drink water -try to avoid fast food and pre-packaged foods, processed meat -most experts advise limiting sodium to < 2300mg  per day, should limit further is any chronic conditions such as high blood pressure, heart disease, diabetes, etc. The American Heart Association advised that < 1500mg  is is ideal -try to avoid foods that contain any ingredients with names you do not recognize  -try to avoid sugar/sweets (except for the natural sugar that occurs in fresh fruit) -try to avoid sweet drinks -try to avoid white rice, white bread, pasta (unless whole grain), white or yellow potatoes  EXERCISE GUIDELINES FOR ADULTS: -if you wish to increase your physical activity, do so gradually and with the approval of your doctor -STOP and seek medical care immediately if you have any chest pain, chest discomfort or trouble breathing when starting or increasing exercise  -move and stretch your body, legs, feet and arms when sitting for long periods -Physical activity guidelines for optimal health in adults: -least 150 minutes per week of aerobic exercise (can talk, but not sing) once approved by your doctor, 20-30 minutes of sustained activity or two 10 minute episodes of sustained activity every day.  -resistance training at least 2 days per week if approved by your doctor -balance exercises 3+ days per week:   Stand somewhere where you  have something sturdy to hold onto if you lose balance.    1) lift up on toes, start with 5x per day and work up to 20x   2) stand and lift on leg  straight out to the side so that foot is a few inches of the floor, start with 5x each side and work up to 20x each side   3) stand on one foot, start with 5 seconds each side and work up to 20 seconds on each side  If you need ideas or help with getting more active:  -Silver sneakers https://tools.silversneakers.com  -Walk with a Doc: http://www.duncan-williams.com/  -try to include resistance (weight lifting/strength building) and balance exercises twice per week: or the following link for ideas: http://castillo-powell.com/  BuyDucts.dk  STRESS MANAGEMENT: -can try meditating, or just sitting quietly with deep breathing while intentionally relaxing all parts of your body for 5 minutes daily -if you need further help with stress, anxiety or depression please follow up with your primary doctor or contact the wonderful folks at [facility]: [phone]  SOCIAL CONNECTIONS: -options in [place] if you wish to engage in more social and exercise related activities:  -Silver sneakers https://tools.silversneakers.com  -Walk with a Doc: http://www.duncan-williams.com/  -Check out the [facility] Active Adults 50+ section on the [place] of [employer] (hiking clubs, book clubs, cards and games, chess, exercise classes, aquatic classes and much more) - see the website for details: https://www.[place].gov/departments/parks-recreation/active-adults50  -YouTube has lots of exercise videos for different ages and abilities as well  -[facility] (a variety of indoor and outdoor inperson activities for adults). [phone]. [address]  -Virtual Online Classes (a variety of topics): see seniorplanet.org or call [phone]  -consider volunteering at a school, hospice center, church, senior center or elsewhere

## 2023-01-23 ENCOUNTER — Ambulatory Visit: Payer: Medicare Other | Admitting: Gastroenterology

## 2023-01-23 ENCOUNTER — Encounter: Payer: Self-pay | Admitting: Gastroenterology

## 2023-01-23 VITALS — BP 136/84 | HR 88 | Ht 61.0 in | Wt 157.4 lb

## 2023-01-23 DIAGNOSIS — K5909 Other constipation: Secondary | ICD-10-CM | POA: Diagnosis not present

## 2023-01-23 DIAGNOSIS — Z86010 Personal history of colonic polyps: Secondary | ICD-10-CM

## 2023-01-23 DIAGNOSIS — R195 Other fecal abnormalities: Secondary | ICD-10-CM | POA: Diagnosis not present

## 2023-01-23 MED ORDER — NA SULFATE-K SULFATE-MG SULF 17.5-3.13-1.6 GM/177ML PO SOLN: 1.0000 | Freq: Once | ORAL | 0 refills | Status: AC

## 2023-01-23 NOTE — Patient Instructions (Signed)
You have been scheduled for a colonoscopy. Please follow written instructions given to you at your visit today.  Please pick up your prep supplies at the pharmacy within the next 1-3 days. If you use inhalers (even only as needed), please bring them with you on the day of your procedure.   _______________________________________________________  If your blood pressure at your visit was 140/90 or greater, please contact your primary care physician to follow up on this.  _______________________________________________________  If you are age 14 or older, your body mass index should be between 23-30. Your Body mass index is 29.74 kg/m. If this is out of the aforementioned range listed, please consider follow up with your Primary Care Provider. __________________________________________________________  The [facility] providers would like to encourage you to use [facility] to communicate with providers for non-urgent requests or questions.  Due to long hold times on the telephone, sending your provider a message by [facility] may be a faster and more efficient way to get a response.  Please allow 48 business hours for a response.  Please remember that this is for non-urgent requests.   Thank you for choosing me and  Gastroenterology.  Vito Cirigliano, D.O.

## 2023-01-23 NOTE — Progress Notes (Signed)
Chief Complaint: Positive FOBT test, history of colon polyps   Referring Provider:     [place], Betty G, MD   HPI:     Bianca Sanchez is a 72 y.o. female with a history of multinodular goiter, HTN, referred to the Gastroenterology Clinic for evaluation of recent FOBT+ stool and hx of colon polyps.   She reports initial colonoscopy in [place] in [place] and notable for large polyp which was endoscopically resected.  She then underwent yearly colonoscopy for several years, then liberalized to every 5 years.  Her last colonoscopy was completed in 2017 in [place] and normal per patient.    Does have a history of constipation and takes senna tea as needed with good relief.  Has been previously prescribed Linzess 72 mcg daily by PCM, but no longer taking and able to control bowel habits largely with increased dietary fiber, fiber supplement, with 2-3 BM daily.  Otherwise, no recent overt bleeding.  FOBT checked as part of routine screening.  Does report a history of prolapse.  No known family history of CRC, GI malignancy, liver disease, pancreatic disease, or IBD.   History obtained via Spanish interpreter in the room.   Past Medical History:  Diagnosis Date   Anxiety    Hyperlipemia    Osteopenia    Varicose veins of both lower extremities      Past Surgical History:  Procedure Laterality Date   INGUINAL HERNIA REPAIR  1980   Family History  Problem Relation Age of Onset   Breast cancer Neg Hx    Social History   Tobacco Use   Smoking status: Never    Passive exposure: Never   Smokeless tobacco: Never  Vaping Use   Vaping Use: Never used  Substance Use Topics   Alcohol use: Never   Drug use: Never   Current Outpatient Medications  Medication Sig Dispense Refill   AMBULATORY NON FORMULARY MEDICATION Colon Plus +fiber blend 1 scoop by mouth daily with water     Ascorbic Acid (VITAMIN C PO) Take 1 capsule by mouth daily.     COLLAGEN PO Take  1 capsule by mouth daily.     diphenhydrAMINE HCl (BENADRYL ALLERGY PO) Take 1 Dose by mouth as needed.     MAGNESIUM PO Take 1 capsule by mouth daily.     Omega-3 1000 MG CAPS Take by mouth.     Vitamin D-Vitamin K (DECARA K) 1250-200 MCG CAPS Take by mouth.     EPINEPHrine (EPIPEN JR) 0.15 MG/0.3ML injection USE AS NEEDED FOR ANAPHYLAXIS (Patient not taking: Reported on 01/23/2023)     No current facility-administered medications for this visit.   Allergies  Allergen Reactions   Acetaminophen Anaphylaxis   Ciprofloxacin Anaphylaxis   Ambien [Zolpidem]    Aspirin    Penicillins      Review of Systems: All systems reviewed and negative except where noted in HPI.     Physical Exam:    Wt Readings from Last 3 Encounters:  01/23/23 157 lb 6 oz (71.4 kg)  12/17/22 157 lb 2 oz (71.3 kg)  05/23/22 157 lb 12.8 oz (71.6 kg)    Ht 5\' 1"  (1.549 m)   Wt 157 lb 6 oz (71.4 kg)   BMI 29.74 kg/m  Constitutional:  Pleasant, in no acute distress. Psychiatric: Normal mood and affect. Behavior is normal. Neurological: Alert and oriented to person place and time.  Skin: Skin is warm and dry. No rashes noted.   ASSESSMENT AND PLAN;   1) FOBT positive stool 2) History of colon polyps - Plan for colonoscopy for further evaluation  3) Chronic constipation - Well-controlled with dietary modifications and fiber supplement - No change to current management - Can certainly evaluate for additional mucosal and luminal pathology time colonoscopy as above  The indications, risks, and benefits of colonoscopy were explained to the patient in detail. Risks include but are not limited to bleeding, perforation, adverse reaction to medications, and cardiopulmonary compromise. Sequelae include but are not limited to the possibility of surgery, hospitalization, and mortality. The patient verbalized understanding and wished to proceed. All questions answered, referred to the scheduler and bowel prep  ordered. Further recommendations pending results of the exam.      Shellia Cleverly, DO, FACG  01/23/2023, 1:30 PM   [place], Betty G, MD

## 2023-01-29 ENCOUNTER — Other Ambulatory Visit: Payer: Self-pay | Admitting: Adult Medicine

## 2023-01-29 DIAGNOSIS — Z0000 Encounter for general adult medical examination without abnormal findings: Secondary | ICD-10-CM

## 2023-02-11 ENCOUNTER — Encounter: Payer: Self-pay | Admitting: Gastroenterology

## 2023-02-20 LAB — LAB REPORT - SCANNED: EGFR: 74

## 2023-02-22 ENCOUNTER — Encounter: Payer: Self-pay | Admitting: Certified Registered Nurse Anesthetist

## 2023-02-27 ENCOUNTER — Encounter: Payer: Self-pay | Admitting: Gastroenterology

## 2023-02-27 ENCOUNTER — Ambulatory Visit (AMBULATORY_SURGERY_CENTER): Payer: Medicare Other | Admitting: Gastroenterology

## 2023-02-27 VITALS — BP 119/89 | HR 64 | Temp 97.7°F | Resp 15 | Ht 61.0 in | Wt 157.0 lb

## 2023-02-27 DIAGNOSIS — R195 Other fecal abnormalities: Secondary | ICD-10-CM

## 2023-02-27 DIAGNOSIS — Z86010 Personal history of colonic polyps: Secondary | ICD-10-CM

## 2023-02-27 DIAGNOSIS — K5730 Diverticulosis of large intestine without perforation or abscess without bleeding: Secondary | ICD-10-CM | POA: Diagnosis not present

## 2023-02-27 MED ORDER — SODIUM CHLORIDE 0.9 % IV SOLN
500.0000 mL | INTRAVENOUS | Status: DC
Start: 2023-02-27 — End: 2023-02-27

## 2023-02-27 NOTE — Op Note (Signed)
[facility] Patient Name: Bianca Sanchez Procedure Date: 02/27/2023 2:14 PM MRN: [id number] Endoscopist: Doristine Locks , MD, [id number] Age: 72 Referring MD:  Date of Birth: 20-Dec-1950 Gender: Female Account #: [id number] Procedure:                Colonoscopy Indications:              Surveillance: Personal history of colonic polyps                            (unknown histology)                           Incidental - Heme positive stool on recent                            screening. No overt bleeding.                           Initial colonoscopy in 2000 in [place] and                            notable for large polyp which was endoscopically                            resected. She then underwent yearly colonoscopy for                            several years, then liberalized to every 5 years.                            Her last colonoscopy was completed in 2017 in                            [place] and normal per patient. Medicines:                Monitored Anesthesia Care Procedure:                Pre-Anesthesia Assessment:                           - Prior to the procedure, a History and Physical                            was performed, and patient medications and                            allergies were reviewed. The patient's tolerance of                            previous anesthesia was also reviewed. The risks                            and benefits of the procedure and the sedation  options and risks were discussed with the patient.                            All questions were answered, and informed consent                            was obtained. Prior Anticoagulants: The patient has                            taken no anticoagulant or antiplatelet agents. ASA                            Grade Assessment: II - A patient with mild systemic                            disease. After reviewing the risks and benefits,                             the patient was deemed in satisfactory condition to                            undergo the procedure.                           After obtaining informed consent, the colonoscope                            was passed under direct vision. Throughout the                            procedure, the patient's blood pressure, pulse, and                            oxygen saturations were monitored continuously. The                            Olympus CF-HQ190L ([id number]) Colonoscope was                            introduced through the anus and advanced to the the                            cecum, identified by appendiceal orifice and                            ileocecal valve. The colonoscopy was performed                            without difficulty. The patient tolerated the                            procedure well. The quality of the bowel  preparation was good. The ileocecal valve,                            appendiceal orifice, and rectum were photographed. Scope In: 2:28:58 PM Scope Out: 2:44:14 PM Scope Withdrawal Time: 0 hours 9 minutes 17 seconds  Total Procedure Duration: 0 hours 15 minutes 16 seconds  Findings:                 The perianal and digital rectal examinations were                            normal.                           Multiple medium-mouthed and small-mouthed                            diverticula were found in the sigmoid colon and                            ascending colon.                           The exam was otherwise normal throughout the                            remainder of the colon.                           The retroflexed view of the distal rectum and anal                            verge was normal and showed no anal or rectal                            abnormalities. Complications:            No immediate complications. Estimated Blood Loss:     Estimated blood loss: none. Impression:               -  Diverticulosis in the sigmoid colon and in the                            ascending colon.                           - The distal rectum and anal verge are normal on                            retroflexion view.                           - No specimens collected. Recommendation:           - Patient has a contact number available for                            emergencies. The signs  and symptoms of potential                            delayed complications were discussed with the                            patient. Return to normal activities tomorrow.                            Written discharge instructions were provided to the                            patient.                           - Resume previous diet.                           - Continue present medications.                           - Repeat colonoscopy is not recommended due to                            current age (35 years or older) for screening                            purposes.                           - Return to GI office PRN. Doristine Locks, MD 02/27/2023 2:51:35 PM

## 2023-02-27 NOTE — Progress Notes (Signed)
VS by DT  Pt's states no medical or surgical changes since previsit or office visit.  

## 2023-02-27 NOTE — Patient Instructions (Signed)
Handout provided on diverticulosis.  Resume previous diet.  Continue present medications.  Repeat colonoscopy is not recommended due to current age (7 years or older) for screening purposes.  Return to GI office as needed.   USTED TUVO UN PROCEDIMIENTO ENDOSCPICO HOY EN EL [facility]:   Lea el informe del procedimiento que se le entreg para cualquier pregunta especfica sobre lo que se Dentist.  Si el informe del examen no responde a sus preguntas, por favor llame a su gastroenterlogo para aclararlo.  Si usted solicit que no se le den [employer] de lo que se Clinical cytogeneticist en su procedimiento al [employer] va a cuidar, entonces el informe del procedimiento se ha incluido en un sobre sellado para que usted lo revise despus cuando le sea ms conveniente.   LO QUE PUEDE ESPERAR: Algunas sensaciones de hinchazn en el abdomen.  Puede tener ms gases de lo normal.  El caminar puede ayudarle a eliminar el aire que se le puso en el tracto gastrointestinal durante el procedimiento y reducir la hinchazn.  Si le hicieron una endoscopia inferior (como una colonoscopia o una sigmoidoscopia flexible), podra notar manchas de sangre en las heces fecales o en el papel higinico.  Si se someti a una preparacin intestinal para su procedimiento, es posible que no tenga una evacuacin intestinal normal durante [employer].   Tenga en cuenta:  Es posible que note un poco de irritacin y congestin en la nariz o algn drenaje.  Esto es debido al oxgeno [employer] durante su procedimiento.  No hay que preocuparse y esto debe desaparecer ms o Regulatory affairs officer.   SNTOMAS PARA REPORTAR INMEDIATAMENTE:  Despus de una endoscopia inferior (colonoscopia o sigmoidoscopia flexible):  Cantidades excesivas de sangre en las heces fecales  Sensibilidad significativa o empeoramiento de los dolores abdominales   Hinchazn aguda del abdomen que antes no tena   Fiebre de 100F o ms   Para  asuntos urgentes o de Associate Professor, puede comunicarse con un gastroenterlogo a cualquier hora llamando al [phone].  DIETA:  Recomendamos una comida pequea al principio, pero luego puede continuar con su dieta normal.  Tome muchos lquidos, Tax adviser las bebidas alcohlicas durante 24 horas.    ACTIVIDAD:  Debe planear tomarse las cosas con calma por el resto del da y no debe CONDUCIR ni usar maquinaria pesada Patent examiner (debido a los medicamentos de sedacin utilizados durante el examen).     SEGUIMIENTO: Nuestro personal llamar al nmero que aparece en su historial al siguiente da hbil de su procedimiento para ver cmo se siente y para responder cualquier pregunta o inquietud que pueda tener con respecto a la informacin que se le dio despus del procedimiento. Si no podemos contactarle, le dejaremos un mensaje.  Sin embargo, si se siente bien y no tiene English as a second language teacher, no es necesario que nos devuelva la llamada.  Asumiremos que ha regresado a sus actividades diarias normales sin incidentes. Si se le tomaron algunas biopsias, le contactaremos por telfono o por carta en las prximas 3 semanas.  Si no ha sabido [employer] biopsias en el transcurso de 3 semanas, por favor llmenos al [phone].   FIRMAS/CONFIDENCIALIDAD: Usted y/o el acompaante que le cuide han firmado documentos que se ingresarn en su historial mdico electrnico.  Estas firmas atestiguan el hecho de que la informacin anterior       YOU HAD AN ENDOSCOPIC PROCEDURE TODAY AT THE [facility]:  Refer to the procedure report that was given to you for any specific questions about what was found during the examination.  If the procedure report does not answer your questions, please call your gastroenterologist to clarify.  If you requested that your care partner not be given the details of your procedure findings, then the procedure report has been included in a sealed envelope for you to  review at your convenience later.  YOU SHOULD EXPECT: Some feelings of bloating in the abdomen. Passage of more gas than usual.  Walking can help get rid of the air that was put into your GI tract during the procedure and reduce the bloating. If you had a lower endoscopy (such as a colonoscopy or flexible sigmoidoscopy) you may notice spotting of blood in your stool or on the toilet paper. If you underwent a bowel prep for your procedure, you may not have a normal bowel movement for a few days.  Please Note:  You might notice some irritation and congestion in your nose or some drainage.  This is from the oxygen used during your procedure.  There is no need for concern and it should clear up in a day or so.  SYMPTOMS TO REPORT IMMEDIATELY:  Following lower endoscopy (colonoscopy or flexible sigmoidoscopy):  Excessive amounts of blood in the stool  Significant tenderness or worsening of abdominal pains  Swelling of the abdomen that is new, acute  Fever of 100F or higher  For urgent or emergent issues, a gastroenterologist can be reached at any hour by calling [phone]. Do not use MyChart messaging for urgent concerns.    DIET:  We do recommend a small meal at first, but then you may proceed to your regular diet.  Drink plenty of fluids but you should avoid alcoholic beverages for 24 hours.  ACTIVITY:  You should plan to take it easy for the rest of today and you should NOT DRIVE or use heavy machinery until tomorrow (because of the sedation medicines used during the test).    FOLLOW UP: Our staff will call the number listed on your records the next business day following your procedure.  We will call around 7:15- 8:00 am to check on you and address any questions or concerns that you may have regarding the information given to you following your procedure. If we do not reach you, we will leave a message.     If any biopsies were taken you will be contacted by phone or by letter within  the next 1-3 weeks.  Please call us at [phone] if you have not heard about the biopsies in 3 weeks.    SIGNATURES/CONFIDENTIALITY: You and/or your care partner have signed paperwork which will be entered into your electronic medical record.  These signatures attest to the fact that that the information above on your After Visit Summary has been reviewed and is understood.  Full responsibility of the confidentiality of this discharge information lies with you and/or your care-partner.

## 2023-02-27 NOTE — Progress Notes (Signed)
GASTROENTEROLOGY PROCEDURE H&P NOTE   Primary Care Physician: [place], Betty G, MD    Reason for Procedure:   Positive FOBT test, history of colon polyps   Plan:    Colonoscopy  Patient is appropriate for endoscopic procedure(s) in the ambulatory ([facility]) setting.  The nature of the procedure, as well as the risks, benefits, and alternatives were carefully and thoroughly reviewed with the patient. Ample time for discussion and questions allowed. The patient understood, was satisfied, and agreed to proceed.     HPI: Bianca Sanchez is a 72 y.o. female who presents for Colonoscopy for evaluation of FOBT+ stool study during recent routine screening.   She reports initial colonoscopy in [place] in [place] and notable for large polyp which was endoscopically resected.  She then underwent yearly colonoscopy for several years, then liberalized to every 5 years.  Her last colonoscopy was completed in 2017 in [place] and normal per patient.    Past Medical History:  Diagnosis Date   Allergy    Anxiety    Hyperlipemia    Osteopenia    Varicose veins of both lower extremities     Past Surgical History:  Procedure Laterality Date   INGUINAL HERNIA REPAIR  1980    Prior to Admission medications   Medication Sig Start Date End Date Taking? Authorizing Provider  AMBULATORY NON FORMULARY MEDICATION Colon Plus +fiber blend 1 scoop by mouth daily with water   Yes [provider]  Ascorbic Acid (VITAMIN C PO) Take 1 capsule by mouth daily.   Yes [provider]  MAGNESIUM PO Take 1 capsule by mouth daily.   Yes [provider]  Omega-3 1000 MG CAPS Take by mouth.   Yes [provider]  Vitamin D-Vitamin K (DECARA K) 1250-200 MCG CAPS Take by mouth.   Yes [provider]  COLLAGEN PO Take 1 capsule by mouth daily.    [provider]  diphenhydrAMINE HCl (BENADRYL ALLERGY PO) Take 1 Dose by mouth as needed.    [provider]  EPINEPHrine (EPIPEN JR) 0.15 MG/0.3ML injection USE AS NEEDED FOR ANAPHYLAXIS Patient not taking: Reported on 01/23/2023 09/16/19   [provider]    Current Outpatient Medications  Medication Sig Dispense Refill   AMBULATORY NON FORMULARY MEDICATION Colon Plus +fiber blend 1 scoop by mouth daily with water     Ascorbic Acid (VITAMIN C PO) Take 1 capsule by mouth daily.     MAGNESIUM PO Take 1 capsule by mouth daily.     Omega-3 1000 MG CAPS Take by mouth.     Vitamin D-Vitamin K (DECARA K) 1250-200 MCG CAPS Take by mouth.     COLLAGEN PO Take 1 capsule by mouth daily.     diphenhydrAMINE HCl (BENADRYL ALLERGY PO) Take 1 Dose by mouth as needed.     EPINEPHrine (EPIPEN JR) 0.15 MG/0.3ML injection USE AS NEEDED FOR ANAPHYLAXIS (Patient not taking: Reported on 01/23/2023)     Current Facility-Administered Medications  Medication Dose Route Frequency Provider Last Rate Last Admin   0.9 %  sodium chloride infusion  500 mL Intravenous Continuous Yamilett Anastos V, DO        Allergies as of 02/27/2023 - Review Complete 02/27/2023  Allergen Reaction Noted   Acetaminophen Anaphylaxis 04/08/2017   Ciprofloxacin Anaphylaxis 11/16/2018   Ambien [zolpidem]  06/28/2020   Aspirin  06/28/2020   Penicillins  06/28/2020    Family History  Problem Relation Age of Onset  Breast cancer Neg Hx    Colon cancer Neg Hx    Esophageal cancer Neg Hx    Stomach cancer Neg Hx    Rectal cancer Neg Hx     Social History   Socioeconomic History   Marital status: Married    Spouse name: Not on file   Number of children: 2   Years of education: Not on file   Highest education level: Not on file  Occupational History   Occupation: retired  Tobacco Use   Smoking status: Never    Passive exposure: Never   Smokeless tobacco: Never  Vaping Use   Vaping Use: Never used  Substance and Sexual Activity   Alcohol use: Never   Drug use: Never   Sexual activity: Not on file   Other Topics Concern   Not on file  Social History Narrative   Not on file   Social Determinants of Health   Financial Resource Strain: Low Risk  (01/21/2023)   Overall Financial Resource Strain (CARDIA)    Difficulty of Paying Living Expenses: Not hard at all  Food Insecurity: No Food Insecurity (01/21/2023)   Hunger Vital Sign    Worried About Running Out of Food in the Last Year: Never true    Ran Out of Food in the Last Year: Never true  Transportation Needs: No Transportation Needs (01/21/2023)   PRAPARE - Administrator, Civil Service (Medical): No    Lack of Transportation (Non-Medical): No  Physical Activity: Insufficiently Active (01/21/2023)   Exercise Vital Sign    Days of Exercise per Week: 3 days    Minutes of Exercise per Session: 10 min  Stress: No Stress Concern Present (01/21/2023)   [employer] of Occupational Health - Occupational Stress Questionnaire    Feeling of Stress : Not at all  Social Connections: Moderately Integrated (01/21/2023)   Social Connection and Isolation Panel [NHANES]    Frequency of Communication with Friends and Family: More than three times a week    Frequency of Social Gatherings with Friends and Family: Never    Attends Religious Services: 1 to 4 times per year    Active Member of [employer] or Organizations: No    Attends Banker Meetings: Never    Marital Status: Married  Catering manager Violence: Not At Risk (01/21/2023)   Humiliation, Afraid, Rape, and Kick questionnaire    Fear of Current or Ex-Partner: No    Emotionally Abused: No    Physically Abused: No    Sexually Abused: No    Physical Exam: Vital signs in last 24 hours: @BP  (!) 168/94   Pulse 78   Temp 97.7 F (36.5 C)   Ht 5\' 1"  (1.549 m)   Wt 157 lb (71.2 kg)   SpO2 100%   BMI 29.66 kg/m  GEN: NAD EYE: Sclerae anicteric ENT: MMM CV: Non-tachycardic Pulm: CTA b/l GI: Soft, NT/ND NEURO:  Alert & Oriented x 3   Doristine Locks,  DO [facility]   02/27/2023 2:22 PM

## 2023-02-27 NOTE — Progress Notes (Signed)
Report given to PACU, vss 

## 2023-02-28 ENCOUNTER — Telehealth: Payer: Self-pay | Admitting: *Deleted

## 2023-02-28 NOTE — Telephone Encounter (Signed)
  Follow up Call-     02/27/2023    1:48 PM  Call back number  Post procedure Call Back phone  # [phone]  Permission to leave phone message Yes     Patient questions:  Do you have a fever, pain , or abdominal swelling? No. Pain Score  0 *  Have you tolerated food without any problems? Yes.    Have you been able to return to your normal activities? Yes.    Do you have any questions about your discharge instructions: Diet   No. Medications  No. Follow up visit  No.  Do you have questions or concerns about your Care? No.  Actions: * If pain score is 4 or above: No action needed, pain <4.

## 2023-04-17 ENCOUNTER — Ambulatory Visit
Admission: RE | Admit: 2023-04-17 | Discharge: 2023-04-17 | Disposition: A | Discharge status: Home / Self Care | Payer: Medicare Other | Source: Ambulatory Visit | Attending: Adult Medicine | Admitting: Adult Medicine

## 2023-04-17 DIAGNOSIS — Z0000 Encounter for general adult medical examination without abnormal findings: Secondary | ICD-10-CM

## 2023-06-18 ENCOUNTER — Encounter: Payer: Self-pay | Admitting: Family Medicine

## 2023-06-18 ENCOUNTER — Ambulatory Visit (INDEPENDENT_AMBULATORY_CARE_PROVIDER_SITE_OTHER): Payer: Medicare Other | Admitting: Family Medicine

## 2023-06-18 VITALS — BP 130/80 | HR 80 | Resp 16 | Ht 61.0 in | Wt 156.5 lb

## 2023-06-18 DIAGNOSIS — K5900 Constipation, unspecified: Secondary | ICD-10-CM | POA: Diagnosis not present

## 2023-06-18 DIAGNOSIS — R7309 Other abnormal glucose: Secondary | ICD-10-CM

## 2023-06-18 DIAGNOSIS — R7401 Elevation of levels of liver transaminase levels: Secondary | ICD-10-CM | POA: Diagnosis not present

## 2023-06-18 DIAGNOSIS — E782 Mixed hyperlipidemia: Secondary | ICD-10-CM

## 2023-06-18 DIAGNOSIS — I10 Essential (primary) hypertension: Secondary | ICD-10-CM | POA: Diagnosis not present

## 2023-06-18 DIAGNOSIS — R202 Paresthesia of skin: Secondary | ICD-10-CM

## 2023-06-18 LAB — POCT GLYCOSYLATED HEMOGLOBIN (HGB A1C): Hemoglobin A1C: 5.1 % (ref 4.0–5.6)

## 2023-06-18 MED ORDER — LINACLOTIDE 72 MCG PO CAPS: 72.0000 ug | ORAL_CAPSULE | Freq: Every day | ORAL | 1 refills | Status: DC

## 2023-06-18 NOTE — Patient Instructions (Addendum)
A few things to remember from today's visit:  Essential (primary) hypertension  Constipation, unspecified constipation type - Plan: linaclotide (LINZESS) 72 MCG capsule  Elevated glucose - Plan: POC HgB A1c  Elevated transaminase level  Paresthesia of left leg Si la sensacion de [employer] en la pierna empeora depronto necesitamos una radiografia de la columna. Linzesss para constipation. Siga dieta baja en grasa. Lab Results  Component Value Date   HGBA1C 5.1 06/18/2023    If you need refills for medications you take chronically, please call your pharmacy. Do not use My Chart to request refills or for acute issues that need immediate attention. If you send a my chart message, it may take a few days to be addressed, specially if I am not in the office.  Please be sure medication list is accurate. If a new problem present, please set up appointment sooner than planned today.

## 2023-06-18 NOTE — Progress Notes (Signed)
HPI: [name] is a 72 y.o. female with a PMHx significant for HTN and suprapubic abdominal pain, who is here today with her husband for chronic disease management.  Last seen on 12/17/2022. She has followed with gynecology since her last visit.   Hypertension:  Medications: Not currently taking BP medication BP readings at home: She has been checking at home. Her husband states her readings have been 120/80 or below.   Negative for unusual or severe headache, visual changes, exertional chest pain, dyspnea,  focal weakness, or edema. She had blood work done recently at [place], CMP,FLP, UA, and CBC on 02/20/23. Na++ 141,K+ 4.9, Cr 0.8,and e GFR 74. LFT's wnl. ALT has been mildly elevated, last one done was normal at 16 (02/20/23). Lab Results  Component Value Date   ALT 39 (H) 02/22/2011   AST 25 02/22/2011   ALKPHOS 63 02/22/2011   BILITOT 0.3 02/22/2011   -HLD: on 02/20/23 TC 201, TG 169,HDL 45, LDL 122. She is on non pharmacologic treatment.  -Constipation:She has bowel movements daily, sometimes she doe snot feel like she empty completely, hard and small stools. She expresses concern over having diarrhea when on medication for constipation.  She also complains of a periodic anal ticklish perianal sensation. She has had problem for a few months before recent colonoscopy. Negative for melena or blood in stool. She has not identified exacerbating or alleviating factors. Negative for dyschezia.  -She mentions she had a renal ultrasound at [facility] which showed a renal cyst, and followed with a nephrologist.   -She also mentions she occasionally has a burning sensation in her left upper inner and anterior thigh. She denies associated back pain, saddle anesthesia, or changes in bowel/bladder function. It started a few weeks ago and has not been frequent.  It usually happens after long sitting and improves with ambulation. She has been using a beet powder, wonders if this  is causing problem.  Glucose mildly elevated in the past, 109. No hx of diabetes.  She mentions that she got a recent present from her son, a dog, frustrates she likes to travel and now needs to think about making arrangements if she does so. She elaborates on past experiences with 2 dogs she had and their illness.  Review of Systems  Constitutional:  Negative for activity change, appetite change and fever.  HENT:  Negative for mouth sores, nosebleeds, sore throat and trouble swallowing.   Respiratory:  Negative for cough and wheezing.   Gastrointestinal:  Negative for abdominal pain, nausea and vomiting.       Negative for changes in bowel habits.  Genitourinary:  Negative for decreased urine volume, dysuria and hematuria.  Neurological:  Negative for syncope, facial asymmetry, weakness and numbness.  Psychiatric/Behavioral:  Negative for confusion and hallucinations. The patient is nervous/anxious.   See other pertinent positives and negatives in HPI.  Current Outpatient Medications on File Prior to Visit  Medication Sig Dispense Refill   AMBULATORY NON FORMULARY MEDICATION Colon Plus +fiber blend 1 scoop by mouth daily with water     Ascorbic Acid (VITAMIN C PO) Take 1 capsule by mouth daily.     COLLAGEN PO Take 1 capsule by mouth daily.     diphenhydrAMINE HCl (BENADRYL ALLERGY PO) Take 1 Dose by mouth as needed.     EPINEPHrine (EPIPEN JR) 0.15 MG/0.3ML injection      MAGNESIUM PO Take 1 capsule by mouth daily.     Omega-3 1000 MG CAPS Take  by mouth.     Vitamin D-Vitamin K (DECARA K) 1250-200 MCG CAPS Take by mouth.     No current facility-administered medications on file prior to visit.    Past Medical History:  Diagnosis Date   Allergy    Anxiety    Hyperlipemia    Osteopenia    Varicose veins of both lower extremities    Allergies  Allergen Reactions   Acetaminophen Anaphylaxis   Ciprofloxacin Anaphylaxis   Ambien [Zolpidem]    Aspirin    Penicillins      Social History   Socioeconomic History   Marital status: Married    Spouse name: Not on file   Number of children: 2   Years of education: Not on file   Highest education level: Not on file  Occupational History   Occupation: retired  Tobacco Use   Smoking status: Never    Passive exposure: Never   Smokeless tobacco: Never  Vaping Use   Vaping status: Never Used  Substance and Sexual Activity   Alcohol use: Never   Drug use: Never   Sexual activity: Not on file  Other Topics Concern   Not on file  Social History Narrative   Not on file   Social Determinants of Health   Financial Resource Strain: Low Risk  (01/21/2023)   Overall Financial Resource Strain (CARDIA)    Difficulty of Paying Living Expenses: Not hard at all  Food Insecurity: No Food Insecurity (01/21/2023)   Hunger Vital Sign    Worried About Running Out of Food in the Last Year: Never true    Ran Out of Food in the Last Year: Never true  Transportation Needs: No Transportation Needs (01/21/2023)   PRAPARE - Administrator, Civil Service (Medical): No    Lack of Transportation (Non-Medical): No  Physical Activity: Insufficiently Active (01/21/2023)   Exercise Vital Sign    Days of Exercise per Week: 3 days    Minutes of Exercise per Session: 10 min  Stress: No Stress Concern Present (01/21/2023)   [employer] of Occupational Health - Occupational Stress Questionnaire    Feeling of Stress : Not at all  Social Connections: Moderately Integrated (01/21/2023)   Social Connection and Isolation Panel [NHANES]    Frequency of Communication with Friends and Family: More than three times a week    Frequency of Social Gatherings with Friends and Family: Never    Attends Religious Services: 1 to 4 times per year    Active Member of [employer] or Organizations: No    Attends Banker Meetings: Never    Marital Status: Married    Vitals:   06/18/23 0848  BP: 130/80  Pulse: 80  Resp: 16   SpO2: 99%   Body mass index is 29.57 kg/m.  Physical Exam Vitals and nursing note reviewed.  Constitutional:      General: She is not in acute distress.    Appearance: She is well-developed.  HENT:     Head: Normocephalic and atraumatic.     Mouth/Throat:     Mouth: Mucous membranes are moist.     Pharynx: Oropharynx is clear.  Eyes:     Conjunctiva/sclera: Conjunctivae normal.  Cardiovascular:     Rate and Rhythm: Normal rate and regular rhythm.     Pulses:          Dorsalis pedis pulses are 2+ on the right side and 2+ on the left side.     Heart sounds: No  murmur heard. Pulmonary:     Effort: Pulmonary effort is normal. No respiratory distress.     Breath sounds: Normal breath sounds.  Abdominal:     Palpations: Abdomen is soft. There is no hepatomegaly or mass.     Tenderness: There is no abdominal tenderness.  Musculoskeletal:     Right lower leg: No edema.     Left lower leg: No edema.  Lymphadenopathy:     Cervical: No cervical adenopathy.  Skin:    General: Skin is warm.     Findings: No erythema or rash.  Neurological:     General: No focal deficit present.     Mental Status: She is alert and oriented to person, place, and time.     Cranial Nerves: No cranial nerve deficit.     Gait: Gait normal.  Psychiatric:        Mood and Affect: Affect normal. Mood is anxious.   ASSESSMENT AND PLAN:  Ms. Nadege Carriger was seen today for chronic disease management.   Orders Placed This Encounter  Procedures   POC HgB A1c   Lab Results  Component Value Date   HGBA1C 5.1 06/18/2023   Constipation, unspecified constipation type Assessment & Plan: Still having some problems, intermittently. Perianal discomfort could be related to hemorrhoidal irritation. She had colonoscopy done in 02/2023. Continue adequate fiber and water intake. She agrees with trying low dose Linzess, side effects discussed.  Orders: -     linaCLOtide; Take 1 capsule (72 mcg total) by mouth  daily before breakfast.  Dispense: 30 capsule; Refill: 1  Essential (primary) hypertension Assessment & Plan: BP today adequate, reporting lower numbers at home. Continue non pharmacologic treatment. Continue monitoring BP regularly. Eye exam is current. F/U in 02/2024, before if needed.  Elevated glucose Normal A1C at 5.1. Continue a healthy life style for diabetes prevention.  -     POCT glycosylated hemoglobin (Hb A1C)  Elevated transaminase level Mildly elevated in the past. ALT in 02/2023 was normal at 16.  Paresthesia of left leg ? Radiculopathy. Other possible etiologies discussed. Monitor for changes, if persistent we can consider lumbar imaging.  Hyperlipidemia, mixed Assessment & Plan: She is not interested in starting medication. Continue non pharmacologic treatment. I think we can re-check it in 02/2024.   I spent a total of 50 minutes in both face to face and non face to face activities for this visit on the date of this encounter. During this time history was obtained and documented, examination was performed, prior labs reviewed, and assessment/plan discussed. In regard to abdominal US, when a simple renal cyst was seen, she will have report faxed to [place]. According to pt, she discussed results with provider who ordered imaging and with nephrologist in [place]  Return in about 8 months (around 02/16/2024) for Labs, CPE.  I, Bianca Sanchez, acting as a scribe for Paticia Moster [place], MD., have documented all relevant documentation on the behalf of Bianca Densmore [place], MD, as directed by  Bianca Stead [place], MD while in the presence of Bianca Connaughton [place], MD.   I, Bianca Spicher [place], MD, have reviewed all documentation for this visit. The documentation on 06/19/23 for the exam, diagnosis, procedures, and orders are all accurate and complete.  Bianca Martine G. [place], MD  [facility]. [place] office.

## 2023-06-18 NOTE — Progress Notes (Deleted)
HPI:  Ms. Bianca Sanchez is a 72 y.o.female here today for his routine physical examination.  Last CPE: ***  Regular exercise 3 or more times per week: *** Following a healthy diet: ***  Chronic medical problems: ***  Immunization History  Administered Date(s) Administered   PFIZER(Purple Top)SARS-COV-2 Vaccination 11/19/2019, 12/10/2019    Health Maintenance  Topic Date Due   Hepatitis C Screening  Never done   DEXA SCAN  Never done   INFLUENZA VACCINE  Never done   COVID-19 Vaccine (3 - 2023-24 season) 05/04/2023   Pneumonia Vaccine 23+ Years old (1 of 1 - PCV) 01/21/2024 (Originally 09/22/2016)   Zoster Vaccines- Shingrix (1 of 2) 04/22/2024 (Originally 09/22/2001)   Medicare Annual Wellness (AWV)  01/21/2024   MAMMOGRAM  04/16/2025   Colonoscopy  02/26/2033   HPV VACCINES  Aged Out   DTaP/Tdap/Td  Discontinued    Last prostate ca screening: ***  -Negative for high alcohol intake or tobacco use.  -Concerns and/or follow up today: ***  Review of Systems  Current Outpatient Medications on File Prior to Visit  Medication Sig Dispense Refill   AMBULATORY NON FORMULARY MEDICATION Colon Plus +fiber blend 1 scoop by mouth daily with water     Ascorbic Acid (VITAMIN C PO) Take 1 capsule by mouth daily.     COLLAGEN PO Take 1 capsule by mouth daily.     diphenhydrAMINE HCl (BENADRYL ALLERGY PO) Take 1 Dose by mouth as needed.     EPINEPHrine (EPIPEN JR) 0.15 MG/0.3ML injection      MAGNESIUM PO Take 1 capsule by mouth daily.     Omega-3 1000 MG CAPS Take by mouth.     Vitamin D-Vitamin K (DECARA K) 1250-200 MCG CAPS Take by mouth.     No current facility-administered medications on file prior to visit.    Past Medical History:  Diagnosis Date   Allergy    Anxiety    Hyperlipemia    Osteopenia    Varicose veins of both lower extremities     Past Surgical History:  Procedure Laterality Date   INGUINAL HERNIA REPAIR  1980    Allergies  Allergen  Reactions   Acetaminophen Anaphylaxis   Ciprofloxacin Anaphylaxis   Ambien [Zolpidem]    Aspirin    Penicillins     Family History  Problem Relation Age of Onset   Breast cancer Neg Hx    Colon cancer Neg Hx    Esophageal cancer Neg Hx    Stomach cancer Neg Hx    Rectal cancer Neg Hx     Social History   Socioeconomic History   Marital status: Married    Spouse name: Not on file   Number of children: 2   Years of education: Not on file   Highest education level: Not on file  Occupational History   Occupation: retired  Tobacco Use   Smoking status: Never    Passive exposure: Never   Smokeless tobacco: Never  Vaping Use   Vaping status: Never Used  Substance and Sexual Activity   Alcohol use: Never   Drug use: Never   Sexual activity: Not on file  Other Topics Concern   Not on file  Social History Narrative   Not on file   Social Determinants of Health   Financial Resource Strain: Low Risk  (01/21/2023)   Overall Financial Resource Strain (CARDIA)    Difficulty of Paying Living Expenses: Not hard at all  Food Insecurity: No  Food Insecurity (01/21/2023)   Hunger Vital Sign    Worried About Running Out of Food in the Last Year: Never true    Ran Out of Food in the Last Year: Never true  Transportation Needs: No Transportation Needs (01/21/2023)   PRAPARE - Administrator, Civil Service (Medical): No    Lack of Transportation (Non-Medical): No  Physical Activity: Insufficiently Active (01/21/2023)   Exercise Vital Sign    Days of Exercise per Week: 3 days    Minutes of Exercise per Session: 10 min  Stress: No Stress Concern Present (01/21/2023)   [employer] of Occupational Health - Occupational Stress Questionnaire    Feeling of Stress : Not at all  Social Connections: Moderately Integrated (01/21/2023)   Social Connection and Isolation Panel [NHANES]    Frequency of Communication with Friends and Family: More than three times a week     Frequency of Social Gatherings with Friends and Family: Never    Attends Religious Services: 1 to 4 times per year    Active Member of [employer] or Organizations: No    Attends Banker Meetings: Never    Marital Status: Married    Vitals:   06/18/23 0848  BP: 130/80  Pulse: 80  Resp: 16  SpO2: 99%   Body mass index is 29.57 kg/m.  Wt Readings from Last 3 Encounters:  06/18/23 156 lb 8 oz (71 kg)  02/27/23 157 lb (71.2 kg)  01/23/23 157 lb 6 oz (71.4 kg)    Physical Exam  ASSESSMENT AND PLAN:  Bianca Sanchez was seen today for medical management of chronic issues.  Diagnoses and all orders for this visit:  Essential (primary) hypertension  Constipation, unspecified constipation type  Elevated glucose  Elevated transaminase level   No orders of the defined types were placed in this encounter.   Essential (primary) hypertension  Constipation, unspecified constipation type  Elevated glucose  Elevated transaminase level    No follow-ups on file.  Bianca Pla G. [place], MD  [facility]. [place] office.

## 2023-06-19 NOTE — Assessment & Plan Note (Signed)
Still having some problems, intermittently. Continue adequate fiber and water intake. She agrees with trying low dose Linzess, side effects discussed.

## 2023-06-19 NOTE — Assessment & Plan Note (Signed)
She is not interested in starting medication. Continue non pharmacologic treatment. I think we can re-check it in 02/2024.

## 2023-06-19 NOTE — Assessment & Plan Note (Signed)
BP today adequate, reporting lower numbers at home. Continue non pharmacologic treatment. Continue monitoring BP regularly. Eye exam is current. F/U in 02/2024, before if needed.

## 2023-09-19 ENCOUNTER — Encounter: Payer: Self-pay | Admitting: Family Medicine

## 2023-09-19 ENCOUNTER — Ambulatory Visit (INDEPENDENT_AMBULATORY_CARE_PROVIDER_SITE_OTHER): Payer: Medicare Other | Admitting: Family Medicine

## 2023-09-19 VITALS — BP 120/70 | HR 82 | Resp 16 | Ht 61.0 in | Wt 154.0 lb

## 2023-09-19 DIAGNOSIS — Z0189 Encounter for other specified special examinations: Secondary | ICD-10-CM

## 2023-09-19 DIAGNOSIS — K5900 Constipation, unspecified: Secondary | ICD-10-CM

## 2023-09-19 DIAGNOSIS — E782 Mixed hyperlipidemia: Secondary | ICD-10-CM | POA: Diagnosis not present

## 2023-09-19 DIAGNOSIS — N816 Rectocele: Secondary | ICD-10-CM

## 2023-09-19 DIAGNOSIS — E559 Vitamin D deficiency, unspecified: Secondary | ICD-10-CM | POA: Insufficient documentation

## 2023-09-19 DIAGNOSIS — N281 Cyst of kidney, acquired: Secondary | ICD-10-CM | POA: Insufficient documentation

## 2023-09-19 NOTE — Progress Notes (Signed)
HPI: Bianca Sanchez is a 73 y.o. female with a PMHx significant for HTN, HLD, constipation, and suprapubic abdominal pain, who is here today for chronic disease management.  Last seen on 06/18/2023.  She has lost some weight since her last visit, which she attributes to some dietary changes.   She mentions she will be traveling to [place] for 15 days in April, and would like to have labs done before then.   Inquiring about renal cyst seen on imaging in the past. I have received records from former PCP. [place] on 02/11/2023 showed a 3.2 cm left renal cyst and a 1.2 cm right renal cyst.  07/31/2021: Left-sided simple cyst and uncomplicated diverticulosis.  Constipation:  She has not been taking Linzess 72 mcg for fear of side effects. She has taken prescription medication in the past that caused perineal pressure.   Hx of rectocele, she has seen uro-gyn and colorectal surgeon; surgical treatment was not considered neccessary. She takes OTC supplements that helped some, still sometimes she has to strain. Negative for blood in stool or melena.  Hypertension:  Not currently on pharmacologic treatment.  Negative severe/frequent headache, visual changes, chest pain, dyspnea, palpitation, claudication, focal weakness, or edema.  02/2023: Na++ 141,K+ 4.9, Cr 0.8,and e GFR 74.  Hyperlipidemia: Not currently on pharmacologic treatment.  She would like to have labs done. 02/20/23 TC 201, TG 169,HDL 45, LDL 122.   Requesting B12 and vit D checked. Hx of vit D deficiency, she is on OTC vit D supplementation.  Review of Systems  Constitutional:  Negative for activity change, appetite change, chills and fever.  HENT:  Negative for sore throat and trouble swallowing.   Respiratory:  Negative for cough and wheezing.   Gastrointestinal:  Negative for abdominal pain, nausea and vomiting.  Endocrine: Negative for cold intolerance and heat intolerance.  Genitourinary:  Negative for decreased  urine volume, dysuria and hematuria.  Skin:  Negative for rash.  Neurological:  Negative for syncope and facial asymmetry.  Psychiatric/Behavioral:  Negative for confusion. The patient is nervous/anxious.   See other pertinent positives and negatives in HPI.  Current Outpatient Medications on File Prior to Visit  Medication Sig Dispense Refill   AMBULATORY NON FORMULARY MEDICATION Colon Plus +fiber blend 1 scoop by mouth daily with water     Ascorbic Acid (VITAMIN C PO) Take 1 capsule by mouth daily.     COLLAGEN PO Take 1 capsule by mouth daily.     diphenhydrAMINE HCl (BENADRYL ALLERGY PO) Take 1 Dose by mouth as needed.     EPINEPHrine (EPIPEN JR) 0.15 MG/0.3ML injection      linaclotide (LINZESS) 72 MCG capsule Take 1 capsule (72 mcg total) by mouth daily before breakfast. 30 capsule 1   MAGNESIUM PO Take 1 capsule by mouth daily.     Omega-3 1000 MG CAPS Take by mouth.     Vitamin D-Vitamin K (DECARA K) 1250-200 MCG CAPS Take by mouth.     No current facility-administered medications on file prior to visit.    Past Medical History:  Diagnosis Date   Allergy    Anxiety    Hyperlipemia    Osteopenia    Varicose veins of both lower extremities    Allergies  Allergen Reactions   Acetaminophen Anaphylaxis   Ciprofloxacin Anaphylaxis   Ambien [Zolpidem]    Aspirin    Penicillins     Social History   Socioeconomic History   Marital status: Married  Spouse name: Not on file   Number of children: 2   Years of education: Not on file   Highest education level: Not on file  Occupational History   Occupation: retired  Tobacco Use   Smoking status: Never    Passive exposure: Never   Smokeless tobacco: Never  Vaping Use   Vaping status: Never Used  Substance and Sexual Activity   Alcohol use: Never   Drug use: Never   Sexual activity: Not on file  Other Topics Concern   Not on file  Social History Narrative   Not on file   Social Drivers of Health   Financial  Resource Strain: Low Risk  (01/21/2023)   Overall Financial Resource Strain (CARDIA)    Difficulty of Paying Living Expenses: Not hard at all  Food Insecurity: No Food Insecurity (01/21/2023)   Hunger Vital Sign    Worried About Running Out of Food in the Last Year: Never true    Ran Out of Food in the Last Year: Never true  Transportation Needs: No Transportation Needs (01/21/2023)   PRAPARE - Administrator, Civil Service (Medical): No    Lack of Transportation (Non-Medical): No  Physical Activity: Insufficiently Active (01/21/2023)   Exercise Vital Sign    Days of Exercise per Week: 3 days    Minutes of Exercise per Session: 10 min  Stress: No Stress Concern Present (01/21/2023)   [employer] of Occupational Health - Occupational Stress Questionnaire    Feeling of Stress : Not at all  Social Connections: Moderately Integrated (01/21/2023)   Social Connection and Isolation Panel [NHANES]    Frequency of Communication with Friends and Family: More than three times a week    Frequency of Social Gatherings with Friends and Family: Never    Attends Religious Services: 1 to 4 times per year    Active Member of [employer] or Organizations: No    Attends Banker Meetings: Never    Marital Status: Married    Vitals:   09/19/23 1020  BP: 120/70  Pulse: 82  Resp: 16  SpO2: 90%   Body mass index is 29.1 kg/m.  Physical Exam Vitals and nursing note reviewed.  Constitutional:      General: She is not in acute distress.    Appearance: She is well-developed and well-groomed.  HENT:     Head: Normocephalic and atraumatic.     Mouth/Throat:     Mouth: Mucous membranes are moist.     Pharynx: Oropharynx is clear. Uvula midline.  Eyes:     Conjunctiva/sclera: Conjunctivae normal.  Cardiovascular:     Rate and Rhythm: Normal rate and regular rhythm.     Pulses:          Dorsalis pedis pulses are 2+ on the right side and 2+ on the left side.     Heart sounds: No  murmur heard. Pulmonary:     Effort: Pulmonary effort is normal. No respiratory distress.     Breath sounds: Normal breath sounds.  Abdominal:     Palpations: Abdomen is soft. There is no hepatomegaly or mass.     Tenderness: There is no abdominal tenderness.  Musculoskeletal:     Right lower leg: No edema.     Left lower leg: No edema.  Lymphadenopathy:     Cervical: No cervical adenopathy.  Skin:    General: Skin is warm.     Findings: No erythema or rash.  Neurological:  General: No focal deficit present.     Mental Status: She is alert and oriented to person, place, and time.     Cranial Nerves: No cranial nerve deficit.     Gait: Gait normal.  Psychiatric:        Mood and Affect: Mood and affect normal.   ASSESSMENT AND PLAN:  Ms. Wanza Aybar was seen today for chronic disease management.   Orders Placed This Encounter  Procedures   Comprehensive metabolic panel   Vitamin B12   VITAMIN D 25 Hydroxy (Vit-D Deficiency, Fractures)   Lipid panel   Hyperlipidemia, mixed Assessment & Plan: Mild. Continue non pharmacologic treatment. She is not fasting today, will come back next week for fasting labs.  Orders: -     Comprehensive metabolic panel; Future -     Lipid panel; Future  Constipation, unspecified constipation type Assessment & Plan: Problem is stable but not well controlled. Continue adequate fiber and fluid intake. Linzess sent to her pharmacy last visit was the lowest dose, side effects reviewed. Last coloscopy 02/2023.   Renal cyst Assessment & Plan: [place] on 02/11/2023 showed a 3.2 cm left renal cyst and a 1.2 cm right renal cyst.  07/31/2021: Left-sided simple cyst. Most likely benign. She has seen urologist.   Vitamin D deficiency, unspecified Assessment & Plan: Continue current dose of vit D supplementation. Further recommendations according to 25 OH vit D result.  Orders: -     VITAMIN D 25 Hydroxy (Vit-D Deficiency, Fractures);  Future  Patient requested diagnostic testing -     Vitamin B12; Future  Rectocele, female Assessment & Plan: Has been evaluated by uro-gynecologist and colorectal surgeon and surgery was not deemed necessary. Continue adequate fiber and fluid intake to avoid constipation.   I spent a total of 40 minutes in both face to face and non face to face activities for this visit on the date of this encounter. During this time history was obtained and documented, examination was performed, prior labs/imaging/records reviewed, and assessment/plan discussed.  Return in about 6 months (around 03/18/2024), or if symptoms worsen or fail to improve, for Fasting labs next week.  I, Rolla Etienne Wierda, acting as a scribe for Siegfried Vieth [place], MD., have documented all relevant documentation on the behalf of Hannahgrace Lalli [place], MD, as directed by  Taden Witter [place], MD while in the presence of Yanett Conkright [place], MD.   I, Chevy Sweigert [place], MD, have reviewed all documentation for this visit. The documentation on 09/20/23 for the exam, diagnosis, procedures, and orders are all accurate and complete.  Aswad Wandrey G. [place], MD  [facility] [place] [facility].

## 2023-09-19 NOTE — Patient Instructions (Addendum)
A few things to remember from today's visit:  Hyperlipidemia, mixed  Constipation, unspecified constipation type  Renal cyst  No cambios hoy.  If you need refills for medications you take chronically, please call your pharmacy. Do not use My Chart to request refills or for acute issues that need immediate attention. If you send a my chart message, it may take a few days to be addressed, specially if I am not in the office.  Please be sure medication list is accurate. If a new problem present, please set up appointment sooner than planned today.

## 2023-09-20 ENCOUNTER — Encounter: Payer: Self-pay | Admitting: Family Medicine

## 2023-09-20 NOTE — Assessment & Plan Note (Addendum)
Problem is stable but not well controlled. Continue adequate fiber and fluid intake. Linzess sent to her pharmacy last visit was the lowest dose, side effects reviewed. Last coloscopy 02/2023.

## 2023-09-20 NOTE — Assessment & Plan Note (Signed)
Has been evaluated by uro-gynecologist and colorectal surgeon and surgery was not deemed necessary. Continue adequate fiber and fluid intake to avoid constipation.

## 2023-09-20 NOTE — Assessment & Plan Note (Signed)
Continue current dose of vit D supplementation. Further recommendations according to 25 OH vit D result. 

## 2023-09-20 NOTE — Assessment & Plan Note (Signed)
[place] on 02/11/2023 showed a 3.2 cm left renal cyst and a 1.2 cm right renal cyst.  07/31/2021: Left-sided simple cyst. Most likely benign. She has seen urologist.

## 2023-09-20 NOTE — Assessment & Plan Note (Signed)
Mild. Continue non pharmacologic treatment. She is not fasting today, will come back next week for fasting labs.

## 2023-09-25 ENCOUNTER — Other Ambulatory Visit (INDEPENDENT_AMBULATORY_CARE_PROVIDER_SITE_OTHER): Payer: Medicare Other

## 2023-09-25 ENCOUNTER — Telehealth: Payer: Self-pay

## 2023-09-25 DIAGNOSIS — E559 Vitamin D deficiency, unspecified: Secondary | ICD-10-CM

## 2023-09-25 DIAGNOSIS — Z0189 Encounter for other specified special examinations: Secondary | ICD-10-CM | POA: Diagnosis not present

## 2023-09-25 DIAGNOSIS — E782 Mixed hyperlipidemia: Secondary | ICD-10-CM

## 2023-09-25 LAB — COMPREHENSIVE METABOLIC PANEL
ALT: 14 U/L (ref 0–35)
AST: 17 U/L (ref 0–37)
Albumin: 4.5 g/dL (ref 3.5–5.2)
Alkaline Phosphatase: 46 U/L (ref 39–117)
BUN: 12 mg/dL (ref 6–23)
CO2: 30 meq/L (ref 19–32)
Calcium: 9.7 mg/dL (ref 8.4–10.5)
Chloride: 104 meq/L (ref 96–112)
Creatinine, Ser: 0.85 mg/dL (ref 0.40–1.20)
GFR: 68.65 mL/min (ref >60.00)
Glucose, Bld: 91 mg/dL (ref 70–99)
Potassium: 4.8 meq/L (ref 3.5–5.1)
Sodium: 141 meq/L (ref 135–145)
Total Bilirubin: 0.5 mg/dL (ref 0.2–1.2)
Total Protein: 6.7 g/dL (ref 6.0–8.3)

## 2023-09-25 LAB — VITAMIN B12: Vitamin B-12: 170 pg/mL — ABNORMAL LOW (ref 211–911)

## 2023-09-25 LAB — LIPID PANEL
Cholesterol: 213 mg/dL — ABNORMAL HIGH (ref 0–200)
HDL: 45.1 mg/dL (ref >39.00)
LDL Cholesterol: 143 mg/dL — ABNORMAL HIGH (ref 0–99)
NonHDL: 167.99
Total CHOL/HDL Ratio: 5
Triglycerides: 125 mg/dL (ref 0.0–149.0)
VLDL: 25 mg/dL (ref 0.0–40.0)

## 2023-09-25 LAB — VITAMIN D 25 HYDROXY (VIT D DEFICIENCY, FRACTURES): VITD: >120 ng/mL

## 2023-09-25 NOTE — Telephone Encounter (Signed)
CRITICAL VALUE STICKER  CRITICAL VALUE: Vit D > 120  RECEIVER (on-site recipient of call): Clayvon Parlett  DATE & TIME NOTIFIED: 3:12 PM  MESSENGER (representative from lab): Saa  MD NOTIFIED: Via epic and verbal   TIME OF NOTIFICATION: 3:13 PM  RESPONSE:  N/A

## 2023-09-25 NOTE — Telephone Encounter (Signed)
Notified of critical lab value, Vitamin D level >120, while pcp ooo.  Have pt take vitamin D supplement every other day instead of daily.  Can have levels rechecked with pcp.

## 2023-09-26 NOTE — Telephone Encounter (Signed)
Patient informed of the message below and voiced understanding. Porfirio Mylar interpreter [phone]

## 2023-11-01 ENCOUNTER — Other Ambulatory Visit: Payer: Self-pay | Admitting: Family Medicine

## 2023-11-01 DIAGNOSIS — E559 Vitamin D deficiency, unspecified: Secondary | ICD-10-CM

## 2023-11-01 DIAGNOSIS — E538 Deficiency of other specified B group vitamins: Secondary | ICD-10-CM | POA: Insufficient documentation

## 2023-11-12 ENCOUNTER — Other Ambulatory Visit (INDEPENDENT_AMBULATORY_CARE_PROVIDER_SITE_OTHER): Payer: Medicare Other

## 2023-11-12 DIAGNOSIS — E538 Deficiency of other specified B group vitamins: Secondary | ICD-10-CM | POA: Diagnosis not present

## 2023-11-12 DIAGNOSIS — E559 Vitamin D deficiency, unspecified: Secondary | ICD-10-CM | POA: Diagnosis not present

## 2023-11-12 LAB — VITAMIN B12: Vitamin B-12: 983 pg/mL — ABNORMAL HIGH (ref 211–911)

## 2023-11-12 LAB — VITAMIN D 25 HYDROXY (VIT D DEFICIENCY, FRACTURES): VITD: 93.55 ng/mL (ref 30.00–100.00)

## 2023-11-15 ENCOUNTER — Encounter: Payer: Self-pay | Admitting: Family Medicine

## 2023-12-05 ENCOUNTER — Other Ambulatory Visit: Payer: Self-pay

## 2023-12-05 ENCOUNTER — Ambulatory Visit
Admission: EM | Admit: 2023-12-05 | Discharge: 2023-12-05 | Disposition: A | Discharge status: Home / Self Care | Attending: Family Medicine | Admitting: Family Medicine

## 2023-12-05 DIAGNOSIS — T7840XA Allergy, unspecified, initial encounter: Secondary | ICD-10-CM

## 2023-12-05 LAB — POCT RAPID STREP A (OFFICE): Rapid Strep A Screen: NEGATIVE

## 2023-12-05 NOTE — ED Provider Notes (Signed)
[facility]    CSN: [id number] Arrival date & time: 12/05/23  1213      History   Chief Complaint Chief Complaint  Patient presents with   Sore Throat    HPI Bianca Sanchez is a 73 y.o. female.   A language interpreter was used.  Sore Throat  Hoarse voice onset yesterday.  States she went to [employer] developed some hoarseness she treated it with Benadryl and salt water gargles and is improved today.  This has happened to her in the past when she goes to [employer] or when she is exposed to smoke with strong odors such as cigarette smoke or perfume.  Denies rhinorrhea, nasal congestion, cough, hot, difficulty swallowing, difficulty breathing, fever, chills, sweats.  Past Medical History:  Diagnosis Date   Allergy    Anxiety    Hyperlipemia    Osteopenia    Varicose veins of both lower extremities     Patient Active Problem List   Diagnosis Date Noted   B12 deficiency 11/01/2023   Renal cyst 09/19/2023   Vitamin D deficiency, unspecified 09/19/2023   Hyperlipidemia, mixed 06/18/2023   Essential (primary) hypertension 12/17/2022   Rectocele, female 12/17/2022   Constipation 12/17/2022   Suprapubic abdominal pain 12/17/2022    Past Surgical History:  Procedure Laterality Date   INGUINAL HERNIA REPAIR  1980    OB History   No obstetric history on file.      Home Medications    Prior to Admission medications   Medication Sig Start Date End Date Taking? Authorizing Provider  AMBULATORY NON FORMULARY MEDICATION Colon Plus +fiber blend 1 scoop by mouth daily with water    [provider]  Ascorbic Acid (VITAMIN C PO) Take 1 capsule by mouth daily.    [provider]  COLLAGEN PO Take 1 capsule by mouth daily.    [provider]  diphenhydrAMINE HCl (BENADRYL ALLERGY PO) Take 1 Dose by mouth as needed.    [provider]  EPINEPHrine (EPIPEN JR) 0.15 MG/0.3ML injection  09/16/19   [provider]   linaclotide Karlene Einstein) 72 MCG capsule Take 1 capsule (72 mcg total) by mouth daily before breakfast. 06/18/23   [place], Betty G, MD  MAGNESIUM PO Take 1 capsule by mouth daily.    [provider]  Omega-3 1000 MG CAPS Take by mouth.    [provider]  Vitamin D-Vitamin K (DECARA K) 1250-200 MCG CAPS Take by mouth.    [provider]    Family History Family History  Problem Relation Age of Onset   Breast cancer Neg Hx    Colon cancer Neg Hx    Esophageal cancer Neg Hx    Stomach cancer Neg Hx    Rectal cancer Neg Hx     Social History Social History   Tobacco Use   Smoking status: Never    Passive exposure: Never   Smokeless tobacco: Never  Vaping Use   Vaping status: Never Used  Substance Use Topics   Alcohol use: Never   Drug use: Never     Allergies   Acetaminophen, Ciprofloxacin, Ambien [zolpidem], Aspirin, and Penicillins   Review of Systems Review of Systems   Physical Exam Triage Vital Signs ED Triage Vitals  Encounter Vitals Group     BP 12/05/23 1241 (!) 147/89     Systolic BP Percentile --      Diastolic BP Percentile --      Pulse Rate 12/05/23  1241 79     Resp 12/05/23 1241 17     Temp 12/05/23 1241 98 F (36.7 C)     Temp Source 12/05/23 1241 Oral     SpO2 12/05/23 1241 97 %     Weight 12/05/23 1241 139 lb (63 kg)     Height 12/05/23 1241 5\' 1"  (1.549 m)     Head Circumference --      Peak Flow --      Pain Score 12/05/23 1259 0     Pain Loc --      Pain Education --      Exclude from Growth Chart --    No data found.  Updated Vital Signs BP (!) 147/89 (BP Location: Right Arm)   Pulse 79   Temp 98 F (36.7 C) (Oral)   Resp 17   Ht 5\' 1"  (1.549 m)   Wt 139 lb (63 kg)   SpO2 97%   BMI 26.26 kg/m   Visual Acuity Right Eye Distance:   Left Eye Distance:   Bilateral Distance:    Right Eye Near:   Left Eye Near:    Bilateral Near:     Physical Exam Vitals and nursing note reviewed.   Constitutional:      Appearance: She is not ill-appearing or toxic-appearing.  HENT:     Head: Normocephalic and atraumatic.     Right Ear: Tympanic membrane and ear canal normal.     Left Ear: Tympanic membrane and ear canal normal.     Mouth/Throat:     Mouth: Mucous membranes are moist. No oral lesions.     Pharynx: Oropharynx is clear. Uvula midline. No pharyngeal swelling, oropharyngeal exudate, posterior oropharyngeal erythema or uvula swelling.     Tonsils: No tonsillar exudate or tonsillar abscesses.     Comments: Slight hoarse voice Cardiovascular:     Rate and Rhythm: Normal rate and regular rhythm.  Pulmonary:     Effort: Pulmonary effort is normal.     Breath sounds: Normal breath sounds. No wheezing or rhonchi.  Musculoskeletal:     Cervical back: Neck supple.  Lymphadenopathy:     Cervical: No cervical adenopathy.  Neurological:     Mental Status: She is alert and oriented to person, place, and time.  Psychiatric:        Mood and Affect: Mood normal.      UC Treatments / Results  Labs (all labs ordered are listed, but only abnormal results are displayed) Labs Reviewed - No data to display  EKG   Radiology No results found.  Procedures Procedures (including critical care time)  Medications Ordered in UC Medications - No data to display  Initial Impression / Assessment and Plan / UC Course  I have reviewed the triage vital signs and the nursing notes.  Pertinent labs & imaging results that were available during my care of the patient were reviewed by me and considered in my medical decision making (see chart for details).     Appearing states she was after going to [employer] or strong odors, she treated with Benadryl and is feeling better today but wanted to get checked.  Recommend she try Claritin or Zyrtec as it is less sedating, follow-up as needed Final Clinical Impressions(s) / UC Diagnoses   Final diagnoses:  Allergy, initial encounter    Discharge Instructions   None    ED Prescriptions   None    PDMP not reviewed this encounter.   Meliton Rattan, PA  12/05/23 1322  

## 2023-12-05 NOTE — ED Triage Notes (Addendum)
 Spanish translator used for clinical intake: Aram Beecham # [id number]  Pt presents with complaints of sore throat and hoarseness in voice x 6 days. Pt currently denies pain & denies fevers at home. Pt states she leaves for [place] tomorrow, would like to be evaluated before departing. OTC allergy medication taken with no noticeable relief. Salt water gargles as well.

## 2023-12-17 ENCOUNTER — Other Ambulatory Visit: Payer: Self-pay

## 2023-12-17 ENCOUNTER — Ambulatory Visit
Admission: EM | Admit: 2023-12-17 | Discharge: 2023-12-17 | Disposition: A | Discharge status: Home / Self Care | Attending: Internal Medicine | Admitting: Internal Medicine

## 2023-12-17 DIAGNOSIS — R1084 Generalized abdominal pain: Secondary | ICD-10-CM

## 2023-12-17 DIAGNOSIS — R112 Nausea with vomiting, unspecified: Secondary | ICD-10-CM

## 2023-12-17 DIAGNOSIS — Z789 Other specified health status: Secondary | ICD-10-CM

## 2023-12-17 DIAGNOSIS — R197 Diarrhea, unspecified: Secondary | ICD-10-CM

## 2023-12-17 MED ORDER — ONDANSETRON 4 MG PO TBDP: 4.0000 mg | ORAL_TABLET | Freq: Three times a day (TID) | ORAL | 0 refills | Status: DC | PRN

## 2023-12-17 MED ORDER — FAMOTIDINE 20 MG PO TABS
20.0000 mg | ORAL_TABLET | Freq: Every day | ORAL | 0 refills | Status: DC
Start: 2023-12-17 — End: 2023-12-17

## 2023-12-17 MED ORDER — FAMOTIDINE 20 MG PO TABS
20.0000 mg | ORAL_TABLET | Freq: Every day | ORAL | 0 refills | Status: DC
Start: 2023-12-17 — End: 2024-03-16

## 2023-12-17 NOTE — ED Provider Notes (Signed)
 [facility]    CSN: [id number] Arrival date & time: 12/17/23  1531      History   Chief Complaint Chief Complaint  Patient presents with  . Abdominal Pain  . Diarrhea    HPI Bianca Sanchez is a 73 y.o. female.   Olean I Lynsay Fesperman is a 73 y.o. female presenting for chief complaint of generalized abdominal pain, nausea, vomiting, and diarrhea that started on December 13, 2023 while in [place].    Abdominal Pain Associated symptoms: diarrhea   Diarrhea Associated symptoms: abdominal pain     Past Medical History:  Diagnosis Date  . Allergy   . Anxiety   . Hyperlipemia   . Osteopenia   . Varicose veins of both lower extremities     Patient Active Problem List   Diagnosis Date Noted  . B12 deficiency 11/01/2023  . Renal cyst 09/19/2023  . Vitamin D deficiency, unspecified 09/19/2023  . Hyperlipidemia, mixed 06/18/2023  . Essential (primary) hypertension 12/17/2022  . Rectocele, female 12/17/2022  . Constipation 12/17/2022  . Suprapubic abdominal pain 12/17/2022    Past Surgical History:  Procedure Laterality Date  . INGUINAL HERNIA REPAIR  1980    OB History   No obstetric history on file.      Home Medications    Prior to Admission medications   Medication Sig Start Date End Date Taking? Authorizing Provider  AMBULATORY NON FORMULARY MEDICATION Colon Plus +fiber blend 1 scoop by mouth daily with water    [provider]  Ascorbic Acid (VITAMIN C PO) Take 1 capsule by mouth daily.    [provider]  COLLAGEN PO Take 1 capsule by mouth daily.    [provider]  diphenhydrAMINE HCl (BENADRYL ALLERGY PO) Take 1 Dose by mouth as needed.    [provider]  EPINEPHrine (EPIPEN JR) 0.15 MG/0.3ML injection  09/16/19   [provider]  famotidine (PEPCID) 20 MG tablet Take 1 tablet (20 mg total) by mouth daily for 7 days. 12/17/23 12/24/23  Carlisle Beers, FNP  linaclotide Karlene Einstein) 72 MCG  capsule Take 1 capsule (72 mcg total) by mouth daily before breakfast. 06/18/23   [place], Betty G, MD  MAGNESIUM PO Take 1 capsule by mouth daily.    [provider]  Omega-3 1000 MG CAPS Take by mouth.    [provider]  ondansetron (ZOFRAN-ODT) 4 MG disintegrating tablet Take 1 tablet (4 mg total) by mouth every 8 (eight) hours as needed for nausea or vomiting. 12/17/23   Carlisle Beers, FNP  Vitamin D-Vitamin K (DECARA K) 1250-200 MCG CAPS Take by mouth.    [provider]    Family History Family History  Problem Relation Age of Onset  . Breast cancer Neg Hx   . Colon cancer Neg Hx   . Esophageal cancer Neg Hx   . Stomach cancer Neg Hx   . Rectal cancer Neg Hx     Social History Social History   Tobacco Use  . Smoking status: Never    Passive exposure: Never  . Smokeless tobacco: Never  Vaping Use  . Vaping status: Never Used  Substance Use Topics  . Alcohol use: Never  . Drug use: Never     Allergies   Acetaminophen, Ciprofloxacin, Ambien [zolpidem], Aspirin, and Penicillins   Review of Systems Review of Systems  Gastrointestinal:  Positive for abdominal pain and diarrhea.     Physical Exam Triage Vital Signs ED Triage Vitals  Encounter Vitals Group     BP 12/17/23 1551 (!) 144/91     Systolic BP Percentile --      Diastolic BP Percentile --      Pulse Rate 12/17/23 1551 88     Resp 12/17/23 1551 16     Temp 12/17/23 1551 98.3 F (36.8 C)     Temp src --      SpO2 12/17/23 1551 94 %     Weight --      Height --      Head Circumference --      Peak Flow --      Pain Score 12/17/23 1555 4     Pain Loc --      Pain Education --      Exclude from Growth Chart --    No data found.  Updated Vital Signs BP (!) 144/91   Pulse 88   Temp 98.3 F (36.8 C)   Resp 16   SpO2 94%   Visual Acuity Right Eye Distance:   Left Eye Distance:   Bilateral Distance:    Right Eye Near:   Left Eye Near:    Bilateral Near:      Physical Exam   UC Treatments / Results  Labs (all labs ordered are listed, but only abnormal results are displayed) Labs Reviewed - No data to display  EKG   Radiology No results found.  Procedures Procedures (including critical care time)  Medications Ordered in UC Medications - No data to display  Initial Impression / Assessment and Plan / UC Course  I have reviewed the triage vital signs and the nursing notes.  Pertinent labs & imaging results that were available during my care of the patient were reviewed by me and considered in my medical decision making (see chart for details).     *** Final Clinical Impressions(s) / UC Diagnoses   Final diagnoses:  Diarrhea, unspecified type  Generalized abdominal pain  Nausea and vomiting, unspecified vomiting type  Recent foreign travel     Discharge Instructions      Please bring the stool sample back to the clinic tomorrow to be evaluated. Staff will call if this is abnormal.   Take pepcid once daily for 7 days for acid reflux.  Your evaluation suggests that your symptoms are most likely due to viral stomach illness (gastroenteritis/"stomach bug") which will improve on its own with rest and fluids in the next few days.   Take zofran to help with nausea every 8 hours as needed. You may use over the counter medicines for aches and pains such as tylenol as needed.  Start sipping on liquids (broth, water, gatorade, etc). If you are able to keep liquids down without vomiting for 1-2 hours, you may eat bland foods like jello, pudding, applesauce, bananas, rice, and white toast. Once you can tolerate blands, you may return to normal diet.   Pedialyte or gatorolyte may help to prevent/fix dehydration due to vomiting and diarrhea.  Please follow up with your primary care provider for further management. Return if you experience worsening or uncontrolled pain, inability to tolerate fluids by mouth, difficulty breathing,  fevers 100.20F or greater, recurrent vomiting, or any other concerning symptoms.     ED Prescriptions     Medication Sig Dispense Auth. Provider   famotidine (PEPCID) 20 MG tablet  (Status: Discontinued) Take 1 tablet (20 mg total) by mouth daily for 7 days. 7 tablet Carlisle Beers, FNP   ondansetron (  ZOFRAN-ODT) 4 MG disintegrating tablet  (Status: Discontinued) Take 1 tablet (4 mg total) by mouth every 8 (eight) hours as needed for nausea or vomiting. 20 tablet Larya Charpentier M, FNP   famotidine (PEPCID) 20 MG tablet Take 1 tablet (20 mg total) by mouth daily for 7 days. 7 tablet Maisa Bedingfield M, FNP   ondansetron (ZOFRAN-ODT) 4 MG disintegrating tablet Take 1 tablet (4 mg total) by mouth every 8 (eight) hours as needed for nausea or vomiting. 20 tablet Starlene Eaton, FNP      PDMP not reviewed this encounter.

## 2023-12-17 NOTE — ED Notes (Signed)
 Pt given instruction and sent home with toilet hat and specimen cup for stool sample.

## 2023-12-17 NOTE — Discharge Instructions (Addendum)
 Please bring the stool sample back to the clinic tomorrow to be evaluated. Staff will call if this is abnormal.   Take pepcid once daily for 7 days for acid reflux.  Your evaluation suggests that your symptoms are most likely due to viral stomach illness (gastroenteritis/"stomach bug") which will improve on its own with rest and fluids in the next few days.   Take zofran to help with nausea every 8 hours as needed. You may use over the counter medicines for aches and pains such as tylenol as needed.  Start sipping on liquids (broth, water, gatorade, etc). If you are able to keep liquids down without vomiting for 1-2 hours, you may eat bland foods like jello, pudding, applesauce, bananas, rice, and white toast. Once you can tolerate blands, you may return to normal diet.   Pedialyte or gatorolyte may help to prevent/fix dehydration due to vomiting and diarrhea.  Please follow up with your primary care provider for further management. Return if you experience worsening or uncontrolled pain, inability to tolerate fluids by mouth, difficulty breathing, fevers 100.47F or greater, recurrent vomiting, or any other concerning symptoms.

## 2023-12-17 NOTE — ED Triage Notes (Addendum)
 Spanish translator [place] [phone]  Pt c/o abdominal pain, diarrhea, and vomiting since April 12th. She came back from [place] yesterday Vomiting has stopped today.

## 2023-12-18 ENCOUNTER — Ambulatory Visit: Payer: Self-pay

## 2023-12-18 ENCOUNTER — Telehealth: Payer: Self-pay | Admitting: Internal Medicine

## 2023-12-18 ENCOUNTER — Encounter: Payer: Self-pay | Admitting: Internal Medicine

## 2023-12-18 DIAGNOSIS — A0811 Acute gastroenteropathy due to Norwalk agent: Secondary | ICD-10-CM

## 2023-12-18 LAB — GASTROINTESTINAL PANEL BY PCR, STOOL (REPLACES STOOL CULTURE)
Adenovirus F40/41: NOT DETECTED
Astrovirus: NOT DETECTED
Campylobacter species: NOT DETECTED
Cryptosporidium: NOT DETECTED
Cyclospora cayetanensis: NOT DETECTED
Entamoeba histolytica: NOT DETECTED
Enteroaggregative E coli (EAEC): DETECTED — AB
Enteropathogenic E coli (EPEC): DETECTED — AB
Enterotoxigenic E coli (ETEC): NOT DETECTED
Giardia lamblia: NOT DETECTED
Norovirus GI/GII: DETECTED — AB
Plesimonas shigelloides: NOT DETECTED
Rotavirus A: NOT DETECTED
Salmonella species: NOT DETECTED
Sapovirus (I, II, IV, and V): NOT DETECTED
Shiga like toxin producing E coli (STEC): NOT DETECTED
Shigella/Enteroinvasive E coli (EIEC): NOT DETECTED
Vibrio cholerae: NOT DETECTED
Vibrio species: NOT DETECTED
Yersinia enterocolitica: NOT DETECTED

## 2023-12-18 MED ORDER — LOPERAMIDE HCL 2 MG PO CAPS: 2.0000 mg | ORAL_CAPSULE | Freq: Four times a day (QID) | ORAL | 0 refills | Status: DC | PRN

## 2023-12-18 NOTE — Telephone Encounter (Signed)
 Copied from CRM [id number]. Topic: Clinical - Red Word Triage >> Dec 18, 2023  5:14 PM Denese Killings wrote: Red Word that prompted transfer to Nurse Triage:  Patient can't keep foods down , uncontrollable diarrhea (watery), feels pain after eating.   Chief Complaint: Diarrhea  Symptoms: Diarrhea, abdominal cramping  Frequency: Multiple times a day  Pertinent Negatives: Patient denies vomiting  Disposition: [] ED /[] Urgent Care (no appt availability in office) / [] Appointment(In office/virtual)/ []  [facility]/ [x] Home Care/ [] Refused Recommended Disposition /[] [facility]/ []  Follow-up with PCP Additional Notes: Patient called with [place] interpreter [phone]. She state she was seen at urgent care yesterday with abdominal pain and diarrhea. She states that she had just returned from [place]. She states that she gave a stool sample that came back positive today and wanted to be treated for those results. I advised that the ordering provider has not had a chance to review those yet and that someone would reach out with results and treatment advice once they have been reviewed. Patient would also like to schedule a follow up with Dr. Sw[place]iland in the office next week. Appointment scheduled for Tuesday for the patient. Patient instructed to call back for new or worsening symptoms. Patient verbalized understanding and agreement with this plan.      Reason for Disposition  SEVERE diarrhea (e.g., 7 or more times / day more than normal)    Seen for same yesterday at urgent care  Answer Assessment - Initial Assessment Questions 1. DIARRHEA SEVERITY: "How bad is the diarrhea?" "How many more stools have you had in the past 24 hours than normal?"    - NO DIARRHEA (SCALE 0)   - MILD (SCALE 1-3): Few loose or mushy BMs; increase of 1-3 stools over normal daily number of stools; mild increase in ostomy output.   -  MODERATE (SCALE 4-7): Increase of 4-6 stools daily over normal; moderate increase in  ostomy output.   -  SEVERE (SCALE 8-10; OR "WORST POSSIBLE"): Increase of 7 or more stools daily over normal; moderate increase in ostomy output; incontinence.     6 episodes today  2. ONSET: "When did the diarrhea begin?"      5 days ago  3. BM CONSISTENCY: "How loose or watery is the diarrhea?"      Watery  4. VOMITING: "Are you also vomiting?" If Yes, ask: "How many times in the past 24 hours?"      No vomiting since 12/13/23 5. ABDOMEN PAIN: "Are you having any abdomen pain?" If Yes, ask: "What does it feel like?" (e.g., crampy, dull, intermittent, constant)      Intermittent crampy  6. ABDOMEN PAIN SEVERITY: If present, ask: "How bad is the pain?"  (e.g., Scale 1-10; mild, moderate, or severe)   - MILD (1-3): doesn't interfere with normal activities, abdomen soft and not tender to touch    - MODERATE (4-7): interferes with normal activities or awakens from sleep, abdomen tender to touch    - SEVERE (8-10): excruciating pain, doubled over, unable to do any normal activities       Mild  7. ORAL INTAKE: If vomiting, "Have you been able to drink liquids?" "How much liquids have you had in the past 24 hours?"     Drinking fluids 8. HYDRATION: "Any signs of dehydration?" (e.g., dry mouth [not just dry lips], too weak to stand, dizziness, new weight loss) "When did you last urinate?"     No signs of dehydrated  9.  EXPOSURE: "Have you traveled to a foreign country recently?" "Have you been exposed to anyone with diarrhea?" "Could you have eaten any food that was spoiled?"     Yes, recently returned from [place]  10. ANTIBIOTIC USE: "Are you taking antibiotics now or have you taken antibiotics in the past 2 months?"       No 11. OTHER SYMPTOMS: "Do you have any other symptoms?" (e.g., fever, blood in stool)       No  Protocols used: Diarrhea-A-AH

## 2023-12-18 NOTE — Telephone Encounter (Signed)
 Opened in error

## 2023-12-18 NOTE — Telephone Encounter (Signed)
 Patient's husband presents to urgent care to request medication for diarrhea on patient's behalf.  I saw the Mrs. Bianca Sanchez yesterday for nausea, vomiting, and diarrhea that started while she was traveling in [place]. We discussed use of over the counter medications for supportive care and symptomatic relief at visit including use of imodium. Patient initially declined imodium and stated she would like to "have the diarrhea process through her body naturally" and not try to stop it.  Husband states she has changed her mind and would like medication assistance to slow down the diarrhea.  GI PCR stool panel shows E. Coli and Norovirus. Suspected norovirus at time of visit. No clinical indication for antibiotics to treat E. Coli per up to date as she has been symptomatic for less than 7 days and stool is without blood/mucous.  She is tolerating fluids well and husband states she has not felt dizzy or weak. She is feeling better overall today.   Imodium ordered to patient's pharmacy.  Continue supportive care and go to ER if symptoms fail to improve or if symptoms worsen in the next few days.   All questions answered, husband will relay this to wife at home.   Hydrographic surveyor used as interpreter for discussion.  Offered professional medical interpreter, patient states it is fine for Ima Maltos to interpret.

## 2023-12-22 NOTE — Telephone Encounter (Signed)
 Noted.

## 2023-12-23 ENCOUNTER — Encounter: Payer: Self-pay | Admitting: Family Medicine

## 2023-12-23 ENCOUNTER — Ambulatory Visit (INDEPENDENT_AMBULATORY_CARE_PROVIDER_SITE_OTHER): Admitting: Family Medicine

## 2023-12-23 VITALS — BP 130/86 | HR 79 | Temp 98.2°F | Resp 16 | Ht 61.0 in | Wt 154.0 lb

## 2023-12-23 DIAGNOSIS — A09 Infectious gastroenteritis and colitis, unspecified: Secondary | ICD-10-CM | POA: Diagnosis not present

## 2023-12-23 NOTE — Progress Notes (Signed)
 ACUTE VISIT Chief Complaint  Patient presents with   Diarrhea   HPI: [name] is a 73 y.o. female with a PMHx significant for HTN, HLD,and IBS constipation who is here today with her husband complaining of episode of diarrhea.  Problem started while she was overseas. States that she tried to be very careful with what she ate, most of the time in the cruise and a few times in the hotel. She drank bottled water. Problem started after eating lentils soup.  Patient complains of diarrhea starting on the night of 4/12 and ending on 4/17. She also had vomiting that night before the diarrhea. She was traveling in [place] and believes the problem was related to the food she was eating. [place] to the [place]  on 4/15. She says she did not eat anything on the plane. Since then, she has been having 2-3 watery stools per day. She has bowel movements shortly after eating. Denies any blood in her stool.  Diarrhea exacerbated by food intake, 30-60 min after.  On 4/16, she was seen in urgent care and given Zofran  and Loperamide , but she has not been taking them. She has been trying to hydrate with electrolytes.  Currently, she is no longer having diarrhea. She has not had any since 4/17.  Denies present nausea, vomiting, or cramps.  She feels back to her normal.     Component Ref Range & Units (hover) 8 d ago  Campylobacter species NOT DETECTED  Plesimonas shigelloides NOT DETECTED  Salmonella species NOT DETECTED  Yersinia enterocolitica NOT DETECTED  Vibrio species NOT DETECTED  Vibrio cholerae NOT DETECTED  Enteroaggregative E coli (EAEC) DETECTED Abnormal   Comment: CRITICAL RESULT CALLED TO, READ BACK BY AND VERIFIED WITH: KAYLA DRISCOLL 12/18/23 @ 1249 BY SH  Enteropathogenic E coli (EPEC) DETECTED Abnormal   Comment: CRITICAL RESULT CALLED TO, READ BACK BY AND VERIFIED WITH: KAYLA DRISCOLL 12/18/23 @ 1249 BY SH  Enterotoxigenic E coli (ETEC) NOT DETECTED  Shiga like toxin  producing E coli (STEC) NOT DETECTED  Shigella/Enteroinvasive E coli (EIEC) NOT DETECTED  Cryptosporidium NOT DETECTED  Cyclospora cayetanensis NOT DETECTED  Entamoeba histolytica NOT DETECTED  Giardia lamblia NOT DETECTED  Adenovirus F40/41 NOT DETECTED  Astrovirus NOT DETECTED  Norovirus GI/GII DETECTED Abnormal      Review of Systems  Constitutional:  Negative for chills, fatigue and fever.  HENT:  Negative for mouth sores and sore throat.   Respiratory:  Negative for cough, shortness of breath and wheezing.   Cardiovascular:  Negative for chest pain, palpitations and leg swelling.  Gastrointestinal:  Negative for abdominal pain.  Genitourinary:  Negative for decreased urine volume, dysuria and hematuria.  Skin:  Negative for rash.  Neurological:  Negative for syncope, weakness and headaches.  See other pertinent positives and negatives in HPI.  Current Outpatient Medications on File Prior to Visit  Medication Sig Dispense Refill   AMBULATORY NON FORMULARY MEDICATION Colon Plus +fiber blend 1 scoop by mouth daily with water     Ascorbic Acid (VITAMIN C PO) Take 1 capsule by mouth daily.     COLLAGEN PO Take 1 capsule by mouth daily.     diphenhydrAMINE HCl (BENADRYL ALLERGY PO) Take 1 Dose by mouth as needed.     EPINEPHrine (EPIPEN JR) 0.15 MG/0.3ML injection      famotidine  (PEPCID ) 20 MG tablet Take 1 tablet (20 mg total) by mouth daily for 7 days. 7 tablet 0   linaclotide  (LINZESS ) 72 MCG  capsule Take 1 capsule (72 mcg total) by mouth daily before breakfast. 30 capsule 1   loperamide  (IMODIUM ) 2 MG capsule Take 1 capsule (2 mg total) by mouth 4 (four) times daily as needed for diarrhea or loose stools. 12 capsule 0   MAGNESIUM PO Take 1 capsule by mouth daily.     Omega-3 1000 MG CAPS Take by mouth.     ondansetron  (ZOFRAN -ODT) 4 MG disintegrating tablet Take 1 tablet (4 mg total) by mouth every 8 (eight) hours as needed for nausea or vomiting. 20 tablet 0   Vitamin  D-Vitamin K (DECARA K) 1250-200 MCG CAPS Take by mouth.     No current facility-administered medications on file prior to visit.    Past Medical History:  Diagnosis Date   Allergy    Anxiety    Hyperlipemia    Osteopenia    Varicose veins of both lower extremities    Allergies  Allergen Reactions   Acetaminophen Anaphylaxis   Ciprofloxacin Anaphylaxis   Ambien [Zolpidem]    Aspirin    Penicillins     Social History   Socioeconomic History   Marital status: Married    Spouse name: Not on file   Number of children: 2   Years of education: Not on file   Highest education level: Not on file  Occupational History   Occupation: retired  Tobacco Use   Smoking status: Never    Passive exposure: Never   Smokeless tobacco: Never  Vaping Use   Vaping status: Never Used  Substance and Sexual Activity   Alcohol use: Never   Drug use: Never   Sexual activity: Not on file  Other Topics Concern   Not on file  Social History Narrative   Not on file   Social Drivers of Health   Financial Resource Strain: Low Risk  (01/21/2023)   Overall Financial Resource Strain (CARDIA)    Difficulty of Paying Living Expenses: Not hard at all  Food Insecurity: No Food Insecurity (01/21/2023)   Hunger Vital Sign    Worried About Running Out of Food in the Last Year: Never true    Ran Out of Food in the Last Year: Never true  Transportation Needs: No Transportation Needs (01/21/2023)   PRAPARE - Administrator, Civil Service (Medical): No    Lack of Transportation (Non-Medical): No  Physical Activity: Insufficiently Active (01/21/2023)   Exercise Vital Sign    Days of Exercise per Week: 3 days    Minutes of Exercise per Session: 10 min  Stress: No Stress Concern Present (01/21/2023)   [employer] of Occupational Health - Occupational Stress Questionnaire    Feeling of Stress : Not at all  Social Connections: Moderately Integrated (01/21/2023)   Social Connection and  Isolation Panel [NHANES]    Frequency of Communication with Friends and Family: More than three times a week    Frequency of Social Gatherings with Friends and Family: Never    Attends Religious Services: 1 to 4 times per year    Active Member of Clubs or Organizations: No    Attends Banker Meetings: Never    Marital Status: Married   Vitals:   12/23/23 1137  BP: 130/86  Pulse: 79  Resp: 16  Temp: 98.2 F (36.8 C)  SpO2: 98%   Body mass index is 29.1 kg/m.  Physical Exam Vitals and nursing note reviewed.  Constitutional:      General: She is not in acute distress.  Appearance: She is well-developed.  HENT:     Head: Normocephalic and atraumatic.     Mouth/Throat:     Mouth: Mucous membranes are moist.     Pharynx: Oropharynx is clear. Uvula midline.  Eyes:     Conjunctiva/sclera: Conjunctivae normal.  Cardiovascular:     Rate and Rhythm: Normal rate and regular rhythm.     Pulses:          Dorsalis pedis pulses are 2+ on the right side and 2+ on the left side.     Heart sounds: No murmur heard. Pulmonary:     Effort: Pulmonary effort is normal. No respiratory distress.     Breath sounds: Normal breath sounds.  Abdominal:     Palpations: Abdomen is soft. There is no hepatomegaly or mass.     Tenderness: There is no abdominal tenderness.  Musculoskeletal:     Right lower leg: No edema.     Left lower leg: No edema.  Lymphadenopathy:     Cervical: No cervical adenopathy.  Skin:    General: Skin is warm.     Findings: No erythema or rash.  Neurological:     General: No focal deficit present.     Mental Status: She is alert and oriented to person, place, and time.     Cranial Nerves: No cranial nerve deficit.     Gait: Gait normal.  Psychiatric:        Mood and Affect: Mood and affect normal.    ASSESSMENT AND PLAN:  Ms. Shamyah Stantz was seen today for diarrhea.   Traveler's diarrhea  We discussed Dx and treatment. Symptoms have resolved,  so I do not think further work up is needed. If symptoms re-occurred ,she was instructed to let me know. Colonoscopy 02/2023.  I spent a total of 32 minutes in both face to face and non face to face activities for this visit on the date of this encounter. During this time history was obtained and documented, examination was performed, prior labs reviewed, and assessment/plan discussed.  Return if symptoms worsen or fail to improve, for keep next appointment.  I, Fritz Jewel Wierda, acting as a scribe for Al Gagen [place], MD., have documented all relevant documentation on the behalf of Eira Alpert [place], MD, as directed by  Demitria Hay [place], MD while in the presence of Michaelanthony Kempton [place], MD.   I, Lailyn Appelbaum [place], MD, have reviewed all documentation for this visit. The documentation on 12/25/23 for the exam, diagnosis, procedures, and orders are all accurate and complete.  Kaliah Haddaway G. [place], MD  [facility]. [place] office.

## 2023-12-23 NOTE — Patient Instructions (Addendum)
 A few things to remember from today's visit:  Traveler's diarrhea Porque la diarrhea se resolvio no necesitamos tratamiento o examenes.

## 2024-01-07 ENCOUNTER — Other Ambulatory Visit: Payer: Self-pay | Admitting: Family Medicine

## 2024-01-07 DIAGNOSIS — Z0000 Encounter for general adult medical examination without abnormal findings: Secondary | ICD-10-CM

## 2024-02-26 ENCOUNTER — Ambulatory Visit: Payer: Self-pay

## 2024-02-26 NOTE — Telephone Encounter (Signed)
 FYI Only or Action Required?: FYI only for provider.  Patient was last seen in primary care on 12/23/2023 by [place], Bianca G, MD. Called Nurse Triage reporting Abdominal Pain. Symptoms began today. Interventions attempted: Nothing. Symptoms are: rapidly worsening.  Triage Disposition: Go to ED Now (Notify PCP)  Patient/caregiver understands and will follow disposition?: Yes    Copied from CRM [id number]. Topic: Clinical - Red Word Triage >> Feb 26, 2024  8:50 AM Berneda FALCON wrote: Red Word that prompted transfer to Nurse Triage: Severe abdominal pains began around 3 AM this morning.  States it was violent and still is, no diarrhea or constipation Reason for Disposition  [1] SEVERE pain AND [2] age > 60 years  Answer Assessment - Initial Assessment Questions 1. LOCATION: Where does it hurt?      Whole abd hurts, states violently without diarrhea or constipation 2. RADIATION: Does the pain shoot anywhere else? (e.Sanchez., chest, back)     To ER 3. ONSET: When did the pain begin? (e.Sanchez., minutes, hours or days ago)      This am 4. SUDDEN: Gradual or sudden onset?     Suddenly; woke her up 5. PATTERN Does the pain come and go, or is it constant?    - If it comes and goes: How long does it last? Do you have pain now?     (Note: Comes and goes means the pain is intermittent. It goes away completely between bouts.)    - If constant: Is it getting better, staying the same, or getting worse?      (Note: Constant means the pain never goes away completely; most serious pain is constant and gets worse.)      ER 6. SEVERITY: How bad is the pain?  (e.Sanchez., Scale 1-10; mild, moderate, or severe)    - MILD (1-3): Doesn't interfere with normal activities, abdomen soft and not tender to touch.     - MODERATE (4-7): Interferes with normal activities or awakens from sleep, abdomen tender to touch.     - SEVERE (8-10): Excruciating pain, doubled over, unable to do any normal activities.        ER 7. RECURRENT SYMPTOM: Have you ever had this type of stomach pain before? If Yes, ask: When was the last time? and What happened that time?      ER 8. CAUSE: What do you think is causing the stomach pain?     ER 9. RELIEVING/AGGRAVATING FACTORS: What makes it better or worse? (e.Sanchez., antacids, bending or twisting motion, bowel movement)     ER  10. OTHER SYMPTOMS: Do you have any other symptoms? (e.Sanchez., back pain, diarrhea, fever, urination pain, vomiting)       ER 11. PREGNANCY: Is there any chance you are pregnant? When was your last menstrual period?       na  Protocols used: Abdominal Pain - Female-A-AH

## 2024-02-27 NOTE — Telephone Encounter (Signed)
 Do not see where pt went to the ED.  Attempted to reach pt with the help of spanish interpreter [place] [phone]) and left a message for pt to return call to the office.

## 2024-03-16 ENCOUNTER — Ambulatory Visit (INDEPENDENT_AMBULATORY_CARE_PROVIDER_SITE_OTHER): Admitting: Family Medicine

## 2024-03-16 DIAGNOSIS — Z0000 Encounter for general adult medical examination without abnormal findings: Secondary | ICD-10-CM | POA: Diagnosis not present

## 2024-03-16 NOTE — Patient Instructions (Signed)
 I really enjoyed getting to talk with you today! I am available on Tuesdays and Thursdays for virtual visits if you have any questions or concerns, or if I can be of any further assistance.   CHECKLIST FROM ANNUAL WELLNESS VISIT:  -Follow up (please call to schedule if not scheduled after visit):   -yearly for annual wellness visit with primary care office  Here is a list of your preventive care/health maintenance measures and the plan for each if any are due:  PLAN For any measures below that may be due:    1. Please get mammogram as planned in August.   Health Maintenance  Topic Date Due   Hepatitis C Screening  Never done   COVID-19 Vaccine (3 - 2024-25 season) 04/01/2024 (Originally 05/04/2023)   Zoster Vaccines- Shingrix (1 of 2) 04/22/2024 (Originally 09/22/2001)   Pneumococcal Vaccine: 50+ Years (1 of 1 - PCV) 03/16/2025 (Originally 09/22/2001)   INFLUENZA VACCINE  04/02/2024   Medicare Annual Wellness (AWV)  03/16/2025   MAMMOGRAM  04/16/2025   Colonoscopy  02/26/2033   DEXA SCAN  Completed   Hepatitis B Vaccines  Aged Out   HPV VACCINES  Aged Out   Meningococcal B Vaccine  Aged Out   DTaP/Tdap/Td  Discontinued    -See a dentist at least yearly  -Get your eyes checked and then per your eye specialist's recommendations  -Other issues addressed today:   -I have included below further information regarding a healthy whole foods based diet, physical activity guidelines for adults, stress management and opportunities for social connections. I hope you find this information useful.   -----------------------------------------------------------------------------------------------------------------------------------------------------------------------------------------------------------------------------------------------------------    NUTRITION: -eat real food: lots of colorful vegetables (half the plate) and fruits -5-7 servings of vegetables and fruits per day (fresh or  steamed is best), exp. 2 servings of vegetables with lunch and dinner and 2 servings of fruit per day. Berries and greens such as kale and collards are great choices.  -consume on a regular basis:  fresh fruits, fresh veggies, fish, nuts, seeds, healthy oils (such as olive oil, avocado oil), whole grains (make sure for bread/pasta/crackers/etc., that the first ingredient on label contains the word whole), legumes. -can eat small amounts of dairy and lean meat (no larger than the palm of your hand), but avoid processed meats such as ham, bacon, lunch meat, etc. -drink water -try to avoid fast food and pre-packaged foods, processed meat, ultra processed foods/beverages (donuts, candy, etc.) -most experts advise limiting sodium to < 2300mg  per day, should limit further is any chronic conditions such as high blood pressure, heart disease, diabetes, etc. The American Heart Association advised that < 1500mg  is is ideal -try to avoid foods/beverages that contain any ingredients with names you do not recognize  -try to avoid foods/beverages  with added sugar or sweeteners/sweets  -try to avoid sweet drinks (including diet drinks): soda, juice, Gatorade, sweet tea, power drinks, diet drinks -try to avoid white rice, white bread, pasta (unless whole grain)  EXERCISE GUIDELINES FOR ADULTS: -if you wish to increase your physical activity, do so gradually and with the approval of your doctor -STOP and seek medical care immediately if you have any chest pain, chest discomfort or trouble breathing when starting or increasing exercise  -move and stretch your body, legs, feet and arms when sitting for long periods -Physical activity guidelines for optimal health in adults: -get at least 150 minutes per week of moderate exercise (can talk, but not sing); this is about 20-30 minutes  of sustained activity 5-7 days per week or two 10-15 minute episodes of sustained activity 5-7 days per week -do some muscle  building/resistance training/strength training at least 2 days per week  -balance exercises 3+ days per week:   Stand somewhere where you have something sturdy to hold onto if you lose balance    1) lift up on toes, then back down, start with 5x per day and work up to 20x   2) stand and lift one leg straight out to the side so that foot is a few inches of the floor, start with 5x each side and work up to 20x each side   3) stand on one foot, start with 5 seconds each side and work up to 20 seconds on each side  If you need ideas or help with getting more active:  -Silver sneakers https://tools.silversneakers.com  -Walk with a Doc: http://www.duncan-williams.com/  -try to include resistance (weight lifting/strength building) and balance exercises twice per week: or the following link for ideas: http://castillo-powell.com/  BuyDucts.dk  STRESS MANAGEMENT: -can try meditating, or just sitting quietly with deep breathing while intentionally relaxing all parts of your body for 5 minutes daily -if you need further help with stress, anxiety or depression please follow up with your primary doctor or contact the wonderful folks at [facility]: [phone]  SOCIAL CONNECTIONS: -options in [place] if you wish to engage in more social and exercise related activities:  -Silver sneakers https://tools.silversneakers.com  -Walk with a Doc: http://www.duncan-williams.[email or web address]/  -Check out the [facility] Active Adults 50+ section on the [place] [employer] (hiking clubs, book clubs, cards and games, chess, exercise classes, aquatic classes and much more) - see the website for details: https://www.[place].gov/departments/parks-recreation/active-adults50  -YouTube has lots of exercise videos for different ages and abilities as well  -[facility] (a variety of indoor and outdoor  inperson activities for adults). [phone]. [address]  -Virtual Online Classes (a variety of topics): see seniorplanet.org or call [phone]  -consider volunteering at a school, hospice center, church, senior center or elsewhere    ADVANCED HEALTHCARE DIRECTIVES:  [place] Advanced Directives assistance:   ExpressWeek.com.cy  Everyone should have advanced health care directives in place. This is so that you get the care you want, should you ever be in a situation where you are unable to make your own medical decisions.   From the  Advanced Directive Website: Advance Health Care Directives are legal documents in which you give written instructions about your health care if, in the future, you cannot speak for yourself.   A health care power of attorney allows you to name a person you trust to make your health care decisions if you cannot make them yourself. A declaration of a desire for a natural death (or living will) is document, which states that you desire not to have your life prolonged by extraordinary measures if you have a terminal or incurable illness or if you are in a vegetative state. An advance instruction for mental health treatment makes a declaration of instructions, information and preferences regarding your mental health treatment. It also states that you are aware that the advance instruction authorizes a mental health treatment provider to act according to your wishes. It may also outline your consent or refusal of mental health treatment. A declaration of an anatomical gift allows anyone over the age of 43 to make a gift by will, organ donor card or other document.   Please see the following website or an  elder law attorney for forms, FAQs and for completion of advanced directives: [place]  Print production planner Health Care Directives Advance Health Care Directives ([email or web address]/)  Or copy  and paste the following to your web browser: [email or web address]

## 2024-03-16 NOTE — Progress Notes (Signed)
 PATIENT CHECK-IN and HEALTH RISK ASSESSMENT QUESTIONNAIRE:  -completed by phone/video for upcoming Medicare Preventive Visit   Pre-Visit Check-in: 1)Vitals (height, wt, BP, etc) - record in vitals section for visit on day of visit Request home vitals (wt, BP, etc.) and enter into vitals, THEN update Vital Signs SmartPhrase below at the top of the HPI. See below.  2)Review and Update Medications, Allergies PMH, Surgeries, Social history in Epic 3)Hospitalizations in the last year with date/reason? n  4)Review and Update Care Team (patient's specialists) in Epic 5) Complete PHQ9 in Epic  6) Complete Fall Screening in Epic 7)Review all Health Maintenance Due and order under PCP if not done.  Medicare Wellness Patient Questionnaire:  Answer theses question about your habits: How often do you have a drink containing alcohol?n How many drinks containing alcohol do you have on a typical day when you are drinking?na How often do you have six or more drinks on one occasion?na Have you ever smoked?n How many packs a day do/did you smoke? na Do you use smokeless tobacco?n Do you use an illicit drugs?n On average, how many days per week do you engage in moderate to strenuous exercise (like a brisk walk)?no, but does not spend times sitting, stays busy Typical diet: likes to eat healthy, veggies, fruits, chia seeds, ginger, cactus, tumeric in diet, organic chicken, eggs, avoids red meat, oatmeal with peanut butter, drinks tea - unsweet Social connections: gets out of the house, loves to interact with others, goes out daily  Answer theses question about your everyday activities: Can you perform most household chores?y Are you deaf or have significant trouble hearing?n Do you feel that you have a problem with memory?n Do you feel safe at home?y Last dentist visit? Goes twice a year 8. Do you have any difficulty performing your everyday activities?n Are you having any difficulty walking, taking  medications on your own, and or difficulty managing daily home needs?n Do you have difficulty walking or climbing stairs?n Do you have difficulty dressing or bathing?n Do you have difficulty doing errands alone such as visiting a doctor's office or shopping?n Do you currently have any difficulty preparing food and eating?n Do you currently have any difficulty using the toilet?n Do you have any difficulty managing your finances?n Do you have any difficulties with housekeeping of managing your housekeeping?n   Do you have Advanced Directives in place (Living Will, Healthcare Power or Attorney)? Has discussed with her family and all on the same page   Last eye Exam and location? [facility]   Do you currently use prescribed or non-prescribed narcotic or opioid pain medications?n  Do you have a history or close family history of breast, ovarian, tubal or peritoneal cancer or a family member with BRCA (breast cancer susceptibility 1 and 2) gene mutations?n   ----------------------------------------------------------------------------------------------------------------------------------------------------------------------------------------------------------------------  Because this visit was a virtual/telehealth visit, some criteria may be missing or patient reported. Any vitals not documented were not able to be obtained and vitals that have been documented are patient reported.    MEDICARE ANNUAL PREVENTIVE VISIT WITH PROVIDER: (Welcome to Medicare, initial annual wellness or annual wellness exam)  Virtual Visit via Phone Note  I connected with Bianca Sanchez on 03/16/24 by telemedicine video application and verified that I am speaking with the correct person using two identifiers.  Location patient: home Location provider:work or home office Persons participating in the virtual visit: patient, provider, interpreter ID [phone]  Concerns and/or follow up today: stable   See  HM section in Epic for other details of completed HM.    ROS: negative for report of fevers, unintentional weight loss, vision changes, vision loss, hearing loss or change, chest pain, sob, hemoptysis, melena, hematochezia, hematuria, falls, bleeding or bruising, thoughts of suicide or self harm, memory loss  Patient-completed extensive health risk assessment - reviewed and discussed with the patient: See Health Risk Assessment completed with patient prior to the visit either above or in recent phone note. This was reviewed in detailed with the patient today and appropriate recommendations, orders and referrals were placed as needed per Summary below and patient instructions.   Review of Medical History: -PMH, PSH, Family History and current specialty and care providers reviewed and updated and listed below   Patient Care Team: [place], Betty G, MD as PCP - General (Family Medicine)   Past Medical History:  Diagnosis Date   Allergy    Anxiety    Hyperlipemia    Osteopenia    Varicose veins of both lower extremities     Past Surgical History:  Procedure Laterality Date   INGUINAL HERNIA REPAIR  1980    Social History   Socioeconomic History   Marital status: Married    Spouse name: Not on file   Number of children: 2   Years of education: Not on file   Highest education level: Not on file  Occupational History   Occupation: retired  Tobacco Use   Smoking status: Never    Passive exposure: Never   Smokeless tobacco: Never  Vaping Use   Vaping status: Never Used  Substance and Sexual Activity   Alcohol use: Never   Drug use: Never   Sexual activity: Not on file  Other Topics Concern   Not on file  Social History Narrative   Not on file   Social Drivers of Health   Financial Resource Strain: Low Risk  (01/21/2023)   Overall Financial Resource Strain (CARDIA)    Difficulty of Paying Living Expenses: Not hard at all  Food Insecurity: No Food Insecurity (01/21/2023)    Hunger Vital Sign    Worried About Running Out of Food in the Last Year: Never true    Ran Out of Food in the Last Year: Never true  Transportation Needs: No Transportation Needs (01/21/2023)   PRAPARE - Administrator, Civil Service (Medical): No    Lack of Transportation (Non-Medical): No  Physical Activity: Insufficiently Active (01/21/2023)   Exercise Vital Sign    Days of Exercise per Week: 3 days    Minutes of Exercise per Session: 10 min  Stress: No Stress Concern Present (01/21/2023)   [employer] of Occupational Health - Occupational Stress Questionnaire    Feeling of Stress : Not at all  Social Connections: Moderately Integrated (01/21/2023)   Social Connection and Isolation Panel    Frequency of Communication with Friends and Family: More than three times a week    Frequency of Social Gatherings with Friends and Family: Never    Attends Religious Services: 1 to 4 times per year    Active Member of [employer] or Organizations: No    Attends Banker Meetings: Never    Marital Status: Married  Catering manager Violence: Not At Risk (01/21/2023)   Humiliation, Afraid, Rape, and Kick questionnaire    Fear of Current or Ex-Partner: No    Emotionally Abused: No    Physically Abused: No    Sexually Abused: No    Family History  Problem Relation Age of Onset   Breast cancer Neg Hx    Colon cancer Neg Hx    Esophageal cancer Neg Hx    Stomach cancer Neg Hx    Rectal cancer Neg Hx     Current Outpatient Medications on File Prior to Visit  Medication Sig Dispense Refill   vitamin E 180 MG (400 UNITS) capsule Take 1 capsule (400 Units total) by mouth daily.     AMBULATORY NON FORMULARY MEDICATION Colon Plus +fiber blend 1 scoop by mouth daily with water     Ascorbic Acid (VITAMIN C PO) Take 1 capsule by mouth daily.     COLLAGEN PO Take 1 capsule by mouth daily.     EPINEPHrine (EPIPEN JR) 0.15 MG/0.3ML injection      MAGNESIUM PO Take 1 capsule by  mouth daily.     Omega-3 1000 MG CAPS Take by mouth.     Vitamin D -Vitamin K (DECARA K) 1250-200 MCG CAPS Take by mouth.     No current facility-administered medications on file prior to visit.    Allergies  Allergen Reactions   Acetaminophen Anaphylaxis   Ciprofloxacin Anaphylaxis   Ambien [Zolpidem]    Aspirin    Penicillins        Physical Exam Vitals requested from patient and listed below if patient had equipment and was able to obtain at home for this virtual visit: There were no vitals filed for this visit. Estimated body mass index is 29.1 kg/m as calculated from the following:   Height as of 12/23/23: 5' 1 (1.549 m).   Weight as of 12/23/23: 154 lb (69.9 kg).  EKG (optional): deferred due to virtual visit  GENERAL: alert, oriented, no acute distress detected, full vision exam deferred due to pandemic and/or virtual encounter  HEENT: atraumatic, conjunttiva clear, no obvious abnormalities on inspection of external nose and ears  NECK: normal movements of the head and neck  LUNGS: on inspection no signs of respiratory distress, breathing rate appears normal, no obvious gross SOB, gasping or wheezing  CV: no obvious cyanosis  MS: moves all visible extremities without noticeable abnormality  PSYCH/NEURO: pleasant and cooperative, no obvious depression or anxiety, speech and thought processing grossly intact, Cognitive function grossly intact  Flowsheet Row Office Visit from 01/21/2023 in [facility]  PHQ-9 Total Score 0        03/16/2024    1:29 PM 01/21/2023    4:31 PM 12/17/2022    8:04 AM  Depression screen PHQ 2/9  Decreased Interest 0 0 0  Down, Depressed, Hopeless 0 0 0  PHQ - 2 Score 0 0 0  Altered sleeping  0   Tired, decreased energy  0   Change in appetite  0   Feeling bad or failure about yourself   0   Trouble concentrating  0   Moving slowly or fidgety/restless  0   Suicidal thoughts  0   PHQ-9 Score  0    Difficult doing work/chores  Not difficult at all        12/17/2022    8:04 AM 01/21/2023    4:31 PM 03/16/2024    1:29 PM  Fall Risk  Falls in the past year? 0 0 0  Was there an injury with Fall? 0 0 0  Fall Risk Category Calculator 0 0 0  Patient at Risk for Falls Due to Other (Comment) No Fall Risks   Fall risk Follow up Falls evaluation completed Falls  evaluation completed Falls evaluation completed     SUMMARY AND PLAN:  Encounter for Medicare annual wellness exam   Discussed applicable health maintenance/preventive health measures and advised and referred or ordered per patient preferences: -declines  vaccines as feels vaccines inject live viruses into you and can make you sick - took opportunity to educate on different types of vaccines, mechanisms, benefits, risks -mammogram is scheduled -found doctor's note from [facility] that states patient had normal dexa in Mar of 2024, patient confirms this Health Maintenance  Topic Date Due   Hepatitis C Screening  Never done   COVID-19 Vaccine (3 - 2024-25 season) 04/01/2024 (Originally 05/04/2023)   Zoster Vaccines- Shingrix (1 of 2) 04/22/2024 (Originally 09/22/2001)   Pneumococcal Vaccine: 50+ Years (1 of 1 - PCV) 03/16/2025 (Originally 09/22/2001)   INFLUENZA VACCINE  04/02/2024   Medicare Annual Wellness (AWV)  03/16/2025   MAMMOGRAM  04/16/2025   Colonoscopy  02/26/2033   DEXA SCAN  Completed   Hepatitis B Vaccines  Aged Out   HPV VACCINES  Aged Out   Meningococcal B Vaccine  Aged Out   DTaP/Tdap/Td  Discontinued      Education and counseling on the following was provided based on the above review of health and a plan/checklist for the patient, along with additional information discussed, was provided for the patient in the patient instructions :  -Provided information on advanced directives - see patient instructions -Reviewed patient's current diet and congratulated on healthy choices. Advised and counseled on a  whole foods based healthy diet. A summary of a healthy diet was provided in the Patient Instructions.  -reviewed patient's current physical activity level and discussed exercise guidelines for adults. Provided community resources and ideas for safe exercise at home to assist in meeting exercise guideline recommendations in a safe and healthy way.  -Advise yearly dental visits at minimum and regular eye exams   Follow up: see patient instructions     Patient Instructions  I really enjoyed getting to talk with you today! I am available on Tuesdays and Thursdays for virtual visits if you have any questions or concerns, or if I can be of any further assistance.   CHECKLIST FROM ANNUAL WELLNESS VISIT:  -Follow up (please call to schedule if not scheduled after visit):   -yearly for annual wellness visit with primary care office  Here is a list of your preventive care/health maintenance measures and the plan for each if any are due:  PLAN For any measures below that may be due:    1. Please get mammogram as planned in August.   Health Maintenance  Topic Date Due   Hepatitis C Screening  Never done   COVID-19 Vaccine (3 - 2024-25 season) 04/01/2024 (Originally 05/04/2023)   Zoster Vaccines- Shingrix (1 of 2) 04/22/2024 (Originally 09/22/2001)   Pneumococcal Vaccine: 50+ Years (1 of 1 - PCV) 03/16/2025 (Originally 09/22/2001)   INFLUENZA VACCINE  04/02/2024   Medicare Annual Wellness (AWV)  03/16/2025   MAMMOGRAM  04/16/2025   Colonoscopy  02/26/2033   DEXA SCAN  Completed   Hepatitis B Vaccines  Aged Out   HPV VACCINES  Aged Out   Meningococcal B Vaccine  Aged Out   DTaP/Tdap/Td  Discontinued    -See a dentist at least yearly  -Get your eyes checked and then per your eye specialist's recommendations  -Other issues addressed today:   -I have included below further information regarding a healthy whole foods based diet, physical activity guidelines for  adults, stress management and  opportunities for social connections. I hope you find this information useful.   -----------------------------------------------------------------------------------------------------------------------------------------------------------------------------------------------------------------------------------------------------------    NUTRITION: -eat real food: lots of colorful vegetables (half the plate) and fruits -5-7 servings of vegetables and fruits per day (fresh or steamed is best), exp. 2 servings of vegetables with lunch and dinner and 2 servings of fruit per day. Berries and greens such as kale and collards are great choices.  -consume on a regular basis:  fresh fruits, fresh veggies, fish, nuts, seeds, healthy oils (such as olive oil, avocado oil), whole grains (make sure for bread/pasta/crackers/etc., that the first ingredient on label contains the word whole), legumes. -can eat small amounts of dairy and lean meat (no larger than the palm of your hand), but avoid processed meats such as ham, bacon, lunch meat, etc. -drink water -try to avoid fast food and pre-packaged foods, processed meat, ultra processed foods/beverages (donuts, candy, etc.) -most experts advise limiting sodium to < 2300mg  per day, should limit further is any chronic conditions such as high blood pressure, heart disease, diabetes, etc. The American Heart Association advised that < 1500mg  is is ideal -try to avoid foods/beverages that contain any ingredients with names you do not recognize  -try to avoid foods/beverages  with added sugar or sweeteners/sweets  -try to avoid sweet drinks (including diet drinks): soda, juice, Gatorade, sweet tea, power drinks, diet drinks -try to avoid white rice, white bread, pasta (unless whole grain)  EXERCISE GUIDELINES FOR ADULTS: -if you wish to increase your physical activity, do so gradually and with the approval of your doctor -STOP and seek medical care immediately if you  have any chest pain, chest discomfort or trouble breathing when starting or increasing exercise  -move and stretch your body, legs, feet and arms when sitting for long periods -Physical activity guidelines for optimal health in adults: -get at least 150 minutes per week of moderate exercise (can talk, but not sing); this is about 20-30 minutes of sustained activity 5-7 days per week or two 10-15 minute episodes of sustained activity 5-7 days per week -do some muscle building/resistance training/strength training at least 2 days per week  -balance exercises 3+ days per week:   Stand somewhere where you have something sturdy to hold onto if you lose balance    1) lift up on toes, then back down, start with 5x per day and work up to 20x   2) stand and lift one leg straight out to the side so that foot is a few inches of the floor, start with 5x each side and work up to 20x each side   3) stand on one foot, start with 5 seconds each side and work up to 20 seconds on each side  If you need ideas or help with getting more active:  -Silver sneakers https://tools.silversneakers.com  -Walk with a Doc: http://www.duncan-williams.com/  -try to include resistance (weight lifting/strength building) and balance exercises twice per week: or the following link for ideas: http://castillo-powell.com/  BuyDucts.dk  STRESS MANAGEMENT: -can try meditating, or just sitting quietly with deep breathing while intentionally relaxing all parts of your body for 5 minutes daily -if you need further help with stress, anxiety or depression please follow up with your primary doctor or contact the wonderful folks at [facility]: [phone]  SOCIAL CONNECTIONS: -options in [place] if you wish to engage in more social and exercise related activities:  -Silver sneakers https://tools.silversneakers.com  -Walk with a  Doc: http://www.duncan-williams.com/  -Check out  the [facility] Active Adults 50+ section on the [employer] (hiking clubs, book clubs, cards and games, chess, exercise classes, aquatic classes and much more) - see the website for details: https://www.[place].gov/departments/parks-recreation/active-adults50  -YouTube has lots of exercise videos for different ages and abilities as well  -[facility] (a variety of indoor and outdoor inperson activities for adults). [phone]. [address]  -Virtual Online Classes (a variety of topics): see seniorplanet.org or call [phone]  -consider volunteering at a school, hospice center, church, senior center or elsewhere    ADVANCED HEALTHCARE DIRECTIVES:  [place] Advanced Directives assistance:   ExpressWeek.com.cy  Everyone should have advanced health care directives in place. This is so that you get the care you want, should you ever be in a situation where you are unable to make your own medical decisions.   From the [place] Advanced Directive Website: Advance Health Care Directives are legal documents in which you give written instructions about your health care if, in the future, you cannot speak for yourself.   A health care power of attorney allows you to name a person you trust to make your health care decisions if you cannot make them yourself. A declaration of a desire for a natural death (or living will) is document, which states that you desire not to have your life prolonged by extraordinary measures if you have a terminal or incurable illness or if you are in a vegetative state. An advance instruction for mental health treatment makes a declaration of instructions, information and preferences regarding your mental health treatment. It also states that you are aware that the advance instruction authorizes a mental health treatment provider to act  according to your wishes. It may also outline your consent or refusal of mental health treatment. A declaration of an anatomical gift allows anyone over the age of 80 to make a gift by will, organ donor card or other document.   Please see the following website or an elder law attorney for forms, FAQs and for completion of advanced directives: [place]  Print production planner Health Care Directives Advance Health Care Directives (http://guzman.[email or web address]/)  Or copy and paste the following to your web browser: [email or web address]          Chiquita JONELLE Cramp, DO

## 2024-03-19 ENCOUNTER — Ambulatory Visit (INDEPENDENT_AMBULATORY_CARE_PROVIDER_SITE_OTHER): Payer: Medicare Other | Admitting: Family Medicine

## 2024-03-19 ENCOUNTER — Encounter: Payer: Self-pay | Admitting: Family Medicine

## 2024-03-19 VITALS — BP 136/80 | HR 76 | Resp 16 | Ht 61.0 in | Wt 149.4 lb

## 2024-03-19 DIAGNOSIS — Z1159 Encounter for screening for other viral diseases: Secondary | ICD-10-CM

## 2024-03-19 DIAGNOSIS — I10 Essential (primary) hypertension: Secondary | ICD-10-CM

## 2024-03-19 DIAGNOSIS — E559 Vitamin D deficiency, unspecified: Secondary | ICD-10-CM | POA: Diagnosis not present

## 2024-03-19 DIAGNOSIS — E782 Mixed hyperlipidemia: Secondary | ICD-10-CM | POA: Diagnosis not present

## 2024-03-19 DIAGNOSIS — M545 Low back pain, unspecified: Secondary | ICD-10-CM

## 2024-03-19 DIAGNOSIS — E538 Deficiency of other specified B group vitamins: Secondary | ICD-10-CM

## 2024-03-19 NOTE — Patient Instructions (Addendum)
 A few things to remember from today's visit:  Hyperlipidemia, mixed  Essential (primary) hypertension  Vitamin D  deficiency, unspecified [facility] en ayunas la proxima semana. Siga tomandose la presion arterial.   If you need refills for medications you take chronically, please call your pharmacy. Do not use My Chart to request refills or for acute issues that need immediate attention. If you send a my chart message, it may take a few days to be addressed, specially if I am not in the office.  Please be sure medication list is accurate. If a new problem present, please set up appointment sooner than planned today.

## 2024-03-19 NOTE — Assessment & Plan Note (Signed)
 Continue vitamin D  10,000 units weekly. Further recommendation will be given according to 25 [place] vitamin D  result.

## 2024-03-19 NOTE — Progress Notes (Signed)
 HPI: BiancaMarley I Bianca Sanchez is a 73 y.o. female with a PMHx significant for HTN and  HLD, who is here today for chronic disease management.   Last seen on 12/23/2023  Hypertension: Not currently on pharmacologic treatment.  She is monitoring her BPs at home.  Reports her BP readings tend to be lower than today's.  Negative severe/frequent headache, visual changes, chest pain, dyspnea, palpitation, claudication, focal weakness, or edema.  BP Readings from Last 3 Encounters:  03/19/24 136/80  12/23/23 130/86  12/17/23 (!) 144/91   Hyperlipidemia: Not currently on pharmacologic treatment.   Lab Results  Component Value Date   CHOL 213 (H) 09/25/2023   HDL 45.10 09/25/2023   LDLCALC 143 (H) 09/25/2023   TRIG 125.0 09/25/2023   CHOLHDL 5 09/25/2023    Vitamin D  Deficiency: Currently taking OTC vitamin D  10,000 once weekly.  She recently went to [place] and reports increased sun exposure at the time.  Lab Results  Component Value Date   VD25OH 93.55 11/12/2023   VD25OH >120 09/25/2023    Vitamin B12: She was previously taking OTC B12 2,000 mcg daily prior to her latest labs.  Since then, she has decreased her dose and is currently taking 500 mcg once daily.  Lab Results  Component Value Date   VITAMINB12 983 (H) 11/12/2023   VITAMINB12 170 (L) 09/25/2023    She endorses a pinching pain in her left lower back.  These episodes occur sporadically and last seconds.  Her pain occasionally radiates down her left leg.  Negative for abdominal pain, bladder/bowel dysfunction, saddle anesthesia, or LE numbness/tingling. Problem has resolved, she increased dose of Tumeric.  She was recently in the process of moving and reports associated increased stress.  She had plans to move to [place] but moved to a new home in [place].   Review of Systems  Constitutional:  Negative for activity change, appetite change and fever.  HENT:  Negative for sore throat.   Respiratory:   Negative for cough and wheezing.   Gastrointestinal:  Negative for abdominal pain, nausea and vomiting.  Endocrine: Negative for cold intolerance and heat intolerance.  Genitourinary:  Negative for decreased urine volume, dysuria and hematuria.  Skin:  Negative for rash.  Neurological:  Negative for syncope and facial asymmetry.  Psychiatric/Behavioral:  Negative for confusion.   See other pertinent positives and negatives in HPI.  Current Outpatient Medications on File Prior to Visit  Medication Sig Dispense Refill   AMBULATORY NON FORMULARY MEDICATION Colon Plus +fiber blend 1 scoop by mouth daily with water     Ascorbic Acid (VITAMIN C PO) Take 1 capsule by mouth daily.     COLLAGEN PO Take 1 capsule by mouth daily.     EPINEPHrine (EPIPEN JR) 0.15 MG/0.3ML injection      MAGNESIUM PO Take 1 capsule by mouth daily.     Omega-3 1000 MG CAPS Take by mouth.     Vitamin D -Vitamin K (DECARA K) 1250-200 MCG CAPS Take by mouth.     vitamin E 180 MG (400 UNITS) capsule Take 1 capsule (400 Units total) by mouth daily.     No current facility-administered medications on file prior to visit.   Past Medical History:  Diagnosis Date   Allergy    Anxiety    Hyperlipemia    Osteopenia    Varicose veins of both lower extremities    Allergies  Allergen Reactions   Acetaminophen Anaphylaxis   Ciprofloxacin Anaphylaxis  Ambien [Zolpidem]    Aspirin    Penicillins     Social History   Socioeconomic History   Marital status: Married    Spouse name: Not on file   Number of children: 2   Years of education: Not on file   Highest education level: Not on file  Occupational History   Occupation: retired  Tobacco Use   Smoking status: Never    Passive exposure: Never   Smokeless tobacco: Never  Vaping Use   Vaping status: Never Used  Substance and Sexual Activity   Alcohol use: Never   Drug use: Never   Sexual activity: Not on file  Other Topics Concern   Not on file  Social  History Narrative   Not on file   Social Drivers of Health   Financial Resource Strain: Low Risk  (01/21/2023)   Overall Financial Resource Strain (CARDIA)    Difficulty of Paying Living Expenses: Not hard at all  Food Insecurity: No Food Insecurity (01/21/2023)   Hunger Vital Sign    Worried About Running Out of Food in the Last Year: Never true    Ran Out of Food in the Last Year: Never true  Transportation Needs: No Transportation Needs (01/21/2023)   PRAPARE - Administrator, Civil Service (Medical): No    Lack of Transportation (Non-Medical): No  Physical Activity: Insufficiently Active (01/21/2023)   Exercise Vital Sign    Days of Exercise per Week: 3 days    Minutes of Exercise per Session: 10 min  Stress: No Stress Concern Present (01/21/2023)   [employer] of Occupational Health - Occupational Stress Questionnaire    Feeling of Stress : Not at all  Social Connections: Moderately Integrated (01/21/2023)   Social Connection and Isolation Panel    Frequency of Communication with Friends and Family: More than three times a week    Frequency of Social Gatherings with Friends and Family: Never    Attends Religious Services: 1 to 4 times per year    Active Member of Clubs or Organizations: No    Attends Banker Meetings: Never    Marital Status: Married    Vitals:   03/19/24 0953  BP: 136/80  Pulse: 76  Resp: 16  SpO2: 97%   Body mass index is 28.22 kg/m.  Physical Exam Vitals and nursing note reviewed.  Constitutional:      General: She is not in acute distress.    Appearance: She is well-developed.  HENT:     Head: Normocephalic and atraumatic.     Mouth/Throat:     Mouth: Mucous membranes are moist.     Pharynx: Oropharynx is clear.  Eyes:     Conjunctiva/sclera: Conjunctivae normal.  Cardiovascular:     Rate and Rhythm: Normal rate and regular rhythm.     Pulses:          Dorsalis pedis pulses are 2+ on the right side and 2+ on  the left side.     Heart sounds: No murmur heard. Pulmonary:     Effort: Pulmonary effort is normal. No respiratory distress.     Breath sounds: Normal breath sounds.  Abdominal:     Palpations: Abdomen is soft. There is no hepatomegaly or mass.     Tenderness: There is no abdominal tenderness.  Musculoskeletal:     Thoracic back: No tenderness.     Lumbar back: No tenderness or bony tenderness.  Lymphadenopathy:     Cervical: No cervical adenopathy.  Skin:    General: Skin is warm.     Findings: No erythema or rash.  Neurological:     General: No focal deficit present.     Mental Status: She is alert and oriented to person, place, and time.     Cranial Nerves: No cranial nerve deficit.     Gait: Gait normal.  Psychiatric:        Mood and Affect: Affect normal. Mood is anxious.    ASSESSMENT AND PLAN:  Bianca Sanchez was seen today for chronic disease management.  Orders Placed This Encounter  Procedures   Basic metabolic panel with GFR   Lipid panel   TSH   Vitamin B12   VITAMIN D  25 Hydroxy (Vit-D Deficiency, Fractures)   Hepatitis C antibody    Left low back pain, unspecified chronicity, unspecified whether sciatica present With occasional radiation to LLE. It has resolved. I do not think imagine is needed today, will consider if it reoccurs. F/U as needed.  Hyperlipidemia, mixed Assessment & Plan: Currently on nonpharmacologic treatment. She is not fasting today. Fasting lipid panel next week.  Orders: -     Basic metabolic panel with GFR; Future -     Lipid panel; Future  Essential (primary) hypertension Assessment & Plan: Currently on nonpharmacologic treatment. Reporting BP readings at home lower than reading today. Continue low-salt diet. Continue monitoring BP regularly. Eye exam is current.  Orders: -     Basic metabolic panel with GFR; Future -     TSH; Future  Vitamin D  deficiency, unspecified Assessment & Plan: Continue  vitamin D  10,000 units weekly. Further recommendation will be given according to 25 OH vitamin D  result.  Orders: -     Basic metabolic panel with GFR; Future -     VITAMIN D  25 Hydroxy (Vit-D Deficiency, Fractures); Future  B12 deficiency Assessment & Plan: Last B12 mildly elevated at 983 (17). She has decreased B12 supplementation from 2500 mcg daily to 500 mcg daily.  Orders: -     Vitamin B12; Future  Encounter for HCV screening test for low risk patient -     Hepatitis C antibody; Future  Return in about 6 months (around 09/19/2024) for Fasting labs next week., CPE.  I, Vernell Forest, acting as a scribe for Horst Ostermiller [place], MD., have documented all relevant documentation on the behalf of Nelli Swalley [place], MD, as directed by   while in the presence of Chelcee Korpi [place], MD.  I, Lyndsee Casa [place], MD, have reviewed all documentation for this visit. The documentation on 03/19/24 for the exam, diagnosis, procedures, and orders are all accurate and complete.  Garris Melhorn G. [place], MD  [facility]. [place] office.

## 2024-03-19 NOTE — Assessment & Plan Note (Signed)
 Currently on nonpharmacologic treatment. Reporting BP readings at home lower than reading today. Continue low-salt diet. Continue monitoring BP regularly. Eye exam is current.

## 2024-03-19 NOTE — Assessment & Plan Note (Signed)
 Currently on nonpharmacologic treatment. She is not fasting today. Fasting lipid panel next week.

## 2024-03-19 NOTE — Assessment & Plan Note (Addendum)
 Last B12 mildly elevated at 983 (17). She has decreased B12 supplementation from 2500 mcg daily to 500 mcg daily.

## 2024-03-23 ENCOUNTER — Telehealth: Payer: Self-pay

## 2024-03-23 ENCOUNTER — Other Ambulatory Visit (INDEPENDENT_AMBULATORY_CARE_PROVIDER_SITE_OTHER)

## 2024-03-23 DIAGNOSIS — Z1159 Encounter for screening for other viral diseases: Secondary | ICD-10-CM

## 2024-03-23 DIAGNOSIS — I10 Essential (primary) hypertension: Secondary | ICD-10-CM | POA: Diagnosis not present

## 2024-03-23 DIAGNOSIS — E782 Mixed hyperlipidemia: Secondary | ICD-10-CM | POA: Diagnosis not present

## 2024-03-23 DIAGNOSIS — E559 Vitamin D deficiency, unspecified: Secondary | ICD-10-CM | POA: Diagnosis not present

## 2024-03-23 DIAGNOSIS — E538 Deficiency of other specified B group vitamins: Secondary | ICD-10-CM | POA: Diagnosis not present

## 2024-03-23 LAB — BASIC METABOLIC PANEL WITH GFR
BUN: 14 mg/dL (ref 6–23)
CO2: 30 meq/L (ref 19–32)
Calcium: 9.4 mg/dL (ref 8.4–10.5)
Chloride: 103 meq/L (ref 96–112)
Creatinine, Ser: 0.82 mg/dL (ref 0.40–1.20)
GFR: 71.42 mL/min (ref >60.00)
Glucose, Bld: 91 mg/dL (ref 70–99)
Potassium: 4.6 meq/L (ref 3.5–5.1)
Sodium: 140 meq/L (ref 135–145)

## 2024-03-23 LAB — LIPID PANEL
Cholesterol: 178 mg/dL (ref 0–200)
HDL: 45 mg/dL (ref >39.00)
LDL Cholesterol: 112 mg/dL — ABNORMAL HIGH (ref 0–99)
NonHDL: 133.42
Total CHOL/HDL Ratio: 4
Triglycerides: 105 mg/dL (ref 0.0–149.0)
VLDL: 21 mg/dL (ref 0.0–40.0)

## 2024-03-23 LAB — VITAMIN D 25 HYDROXY (VIT D DEFICIENCY, FRACTURES): VITD: 105.14 ng/mL (ref 30.00–100.00)

## 2024-03-23 LAB — TSH: TSH: 1.95 u[IU]/mL (ref 0.35–5.50)

## 2024-03-23 LAB — VITAMIN B12: Vitamin B-12: 542 pg/mL (ref 211–911)

## 2024-03-23 NOTE — Telephone Encounter (Signed)
 Critical Vit D of 105.14.

## 2024-03-23 NOTE — Telephone Encounter (Signed)
 I spoke with pt, she is aware to stop Vit D supplements.

## 2024-03-24 ENCOUNTER — Ambulatory Visit: Payer: Self-pay | Admitting: Family Medicine

## 2024-03-24 LAB — HEPATITIS C ANTIBODY: Hepatitis C Ab: NONREACTIVE

## 2024-04-19 ENCOUNTER — Ambulatory Visit
Admission: RE | Admit: 2024-04-19 | Discharge: 2024-04-19 | Disposition: A | Discharge status: Home / Self Care | Source: Ambulatory Visit | Attending: Family Medicine | Admitting: Family Medicine

## 2024-04-19 DIAGNOSIS — Z0000 Encounter for general adult medical examination without abnormal findings: Secondary | ICD-10-CM

## 2024-06-02 ENCOUNTER — Ambulatory Visit: Admitting: Family Medicine

## 2024-06-02 ENCOUNTER — Encounter: Payer: Self-pay | Admitting: Family Medicine

## 2024-06-02 VITALS — BP 145/9 | HR 70 | Temp 98.3°F | Resp 16 | Ht 61.0 in | Wt 147.0 lb

## 2024-06-02 DIAGNOSIS — I10 Essential (primary) hypertension: Secondary | ICD-10-CM | POA: Diagnosis not present

## 2024-06-02 DIAGNOSIS — W19XXXA Unspecified fall, initial encounter: Secondary | ICD-10-CM

## 2024-06-02 DIAGNOSIS — G44319 Acute post-traumatic headache, not intractable: Secondary | ICD-10-CM

## 2024-06-02 NOTE — Progress Notes (Unsigned)
 ACUTE VISIT Chief Complaint  Patient presents with   Headache   HPI: Bianca Sanchez is a 73 y.o. female, who is here today complaining of *** HPI Discussed the use of AI scribe software for clinical note transcription with the patient, who gave verbal consent to proceed.  History of Present Illness Bianca Sanchez is a 73 year old female who presents with concerns about head trauma and mild headaches following multiple falls.  Over the past week, she has experienced multiple falls resulting in mild head trauma. She described an incident where she fell while moving items down the stairs at her son's house, landing on her chest and developing a hematoma. Despite this, she did not seek immediate medical attention.  Recently, she sustained additional minor injuries, including hitting her head on the edge of a surface while picking up dog toys and being struck by a corner of a cardboard box, resulting in a small bump on her head. She applied ice to the area, and the bump has decreased in size.  She experiences mild, intermittent headaches that she attributes to tension, stating they are not severe enough to require medication. No nausea, vomiting, loss of consciousness, or visual disturbances. She notes tenderness on the right side of her head where she was struck, but no severe or persistent pain.  She monitors her blood pressure at home, which she reports as consistently normal, though it was elevated during the visit. She attributes this to stress and the anxiety of visiting medical facilities.  Her social history includes recent stress related to moving into a new home and managing household responsibilities, including caring for a dog that has significantly altered her daily routine.   Review of Systems See other pertinent positives and negatives in HPI.  Current Outpatient Medications on File Prior to Visit  Medication Sig Dispense Refill   AMBULATORY NON FORMULARY  MEDICATION Colon Plus +fiber blend 1 scoop by mouth daily with water     Ascorbic Acid (VITAMIN C PO) Take 1 capsule by mouth daily.     COLLAGEN PO Take 1 capsule by mouth daily.     EPINEPHrine (EPIPEN JR) 0.15 MG/0.3ML injection      MAGNESIUM PO Take 1 capsule by mouth daily.     Omega-3 1000 MG CAPS Take by mouth.     Vitamin D -Vitamin K (DECARA K) 1250-200 MCG CAPS Take by mouth.     vitamin E 180 MG (400 UNITS) capsule Take 1 capsule (400 Units total) by mouth daily.     No current facility-administered medications on file prior to visit.    Past Medical History:  Diagnosis Date   Allergy    Anxiety    Hyperlipemia    Osteopenia    Varicose veins of both lower extremities    Allergies  Allergen Reactions   Acetaminophen Anaphylaxis   Ciprofloxacin Anaphylaxis   Ambien [Zolpidem]    Aspirin    Penicillins     Social History   Socioeconomic History   Marital status: Married    Spouse name: Not on file   Number of children: 2   Years of education: Not on file   Highest education level: Not on file  Occupational History   Occupation: retired  Tobacco Use   Smoking status: Never    Passive exposure: Never   Smokeless tobacco: Never  Vaping Use   Vaping status: Never Used  Substance and Sexual Activity   Alcohol use: Never  Drug use: Never   Sexual activity: Not on file  Other Topics Concern   Not on file  Social History Narrative   Not on file   Social Drivers of Health   Financial Resource Strain: Low Risk  (01/21/2023)   Overall Financial Resource Strain (CARDIA)    Difficulty of Paying Living Expenses: Not hard at all  Food Insecurity: No Food Insecurity (01/21/2023)   Hunger Vital Sign    Worried About Running Out of Food in the Last Year: Never true    Ran Out of Food in the Last Year: Never true  Transportation Needs: No Transportation Needs (01/21/2023)   PRAPARE - Administrator, Civil Service (Medical): No    Lack of  Transportation (Non-Medical): No  Physical Activity: Insufficiently Active (01/21/2023)   Exercise Vital Sign    Days of Exercise per Week: 3 days    Minutes of Exercise per Session: 10 min  Stress: No Stress Concern Present (01/21/2023)   [employer] of Occupational Health - Occupational Stress Questionnaire    Feeling of Stress : Not at all  Social Connections: Moderately Integrated (01/21/2023)   Social Connection and Isolation Panel    Frequency of Communication with Friends and Family: More than three times a week    Frequency of Social Gatherings with Friends and Family: Never    Attends Religious Services: 1 to 4 times per year    Active Member of Clubs or Organizations: No    Attends Banker Meetings: Never    Marital Status: Married    Vitals:   06/02/24 1508  BP: (!) 150/84  Pulse: 70  Resp: 16  Temp: 98.3 F (36.8 C)  SpO2: 97%   Body mass index is 27.78 kg/m.  Physical Exam  ASSESSMENT AND PLAN: There are no diagnoses linked to this encounter.  No follow-ups on file.  Janashia Parco G. [place], MD  [facility]. [place] [facility].  Discharge Instructions   None

## 2024-06-02 NOTE — Patient Instructions (Addendum)
 A few things to remember from today's visit:  Essential (primary) hypertension  Acute post-traumatic headache, not intractable - Plan: CT HEAD WO CONTRAST ( )

## 2024-06-04 ENCOUNTER — Telehealth: Payer: Self-pay | Admitting: *Deleted

## 2024-06-04 ENCOUNTER — Ambulatory Visit: Admitting: Family Medicine

## 2024-06-04 ENCOUNTER — Ambulatory Visit: Payer: Self-pay

## 2024-06-04 NOTE — Telephone Encounter (Signed)
 FYI Only or Action Required?: FYI only for provider.  Patient was last seen in primary care on 06/02/2024 by [place], Betty G, MD.  Called Nurse Triage reporting Hypertension. Pt hit her head which she believes is causing HA. No HA now.  Symptoms began yesterday.  Interventions attempted: Nothing.  Symptoms are: stable.  Triage Disposition: See PCP When Office is Open (Within 3 Days)  Patient/caregiver understands and will follow disposition?: yes -                    Copied from CRM #[id number]. Topic: Clinical - Red Word Triage >> Jun 04, 2024  8:03 AM Bianca Sanchez wrote: Red Word that prompted transfer to Nurse Triage: Patient states she would like to speak the nurse about her blood pressure. States the most recent reading is 150/96 but wants an appt today. I offered her an appt with other providers as Dr. [place] does not have anything until Monday but she only wants to see Dr. [place].  Patient is Spanish-speaking with interpreter on the line and is requesting to speak to the nurse regarding blood pressure Reason for Disposition  Systolic BP >= 160 OR Diastolic >= 100  Answer Assessment - Initial Assessment Questions 1. BLOOD PRESSURE: What is your blood pressure? Did you take at least two measurements 5 minutes apart?     150/96 - 7:48 am , 133/93  - 7am 2. ONSET: When did you take your blood pressure?     This morning 3. HOW: How did you take your blood pressure? (e.g., automatic home BP monitor, visiting nurse)     Automatic 4. HISTORY: Do you have a history of high blood pressure? Were you diagnosed with preeclampsia during this or previous pregnancies?     no 5. MEDICINES: Are you taking any medicines for blood pressure? Have you missed any doses recently?     no  6. OTHER SYMPTOMS: Do you have any symptoms? (e.g., blurred vision, chest pain, difficulty breathing, headache, weakness)     Pt states she has a HA, body feels weird, Feels like  indigestion. Nausea  Protocols used: Blood Pressure - High-A-AH

## 2024-06-04 NOTE — Telephone Encounter (Signed)
 Copied from CRM [id number]. Topic: Clinical - Medication Question >> Jun 04, 2024  7:56 AM Bianca Sanchez wrote: Reason for CRM: Patient states that she is supposed to have been prescribed blood pressure medication but has not been prescribed it yet and would like to know what prescription she is going to be on and if we can call it to the following pharmacy:  [id number] [place], [place] - [address] AT [facility] [address] [facility] [address] [place] [address] Phone: [phone] Fax: [phone] Hours: Not open 24 hours

## 2024-06-04 NOTE — Telephone Encounter (Signed)
 She reported normal BPs at home, so I recommended continuing non pharmacologic. Thanks, BJ

## 2024-06-05 NOTE — Telephone Encounter (Signed)
 She has not been interested in pharmacologic treatment. Her husband reported BP's 120's/70's, so we agreed on non pharmacologic treatment. Head CT is pending. Thanks, BJ

## 2024-06-07 ENCOUNTER — Encounter: Payer: Self-pay | Admitting: Family Medicine

## 2024-06-07 ENCOUNTER — Ambulatory Visit (INDEPENDENT_AMBULATORY_CARE_PROVIDER_SITE_OTHER): Admitting: Family Medicine

## 2024-06-07 VITALS — BP 138/90 | HR 60 | Temp 97.9°F | Resp 16 | Ht 61.0 in | Wt 151.2 lb

## 2024-06-07 DIAGNOSIS — R519 Headache, unspecified: Secondary | ICD-10-CM | POA: Diagnosis not present

## 2024-06-07 DIAGNOSIS — I10 Essential (primary) hypertension: Secondary | ICD-10-CM | POA: Diagnosis not present

## 2024-06-07 MED ORDER — PROPRANOLOL HCL 10 MG PO TABS: 10.0000 mg | ORAL_TABLET | Freq: Two times a day (BID) | ORAL | 1 refills | Status: DC | PRN

## 2024-06-07 NOTE — Progress Notes (Signed)
 ACUTE VISIT Chief Complaint  Patient presents with   Hypertension   Discussed the use of AI scribe software for clinical note transcription with the patient, who gave verbal consent to proceed.  History of Present Illness Bianca Sanchez is a 73 year old female with past medical history significant for hypertension and hyperlipidemia here today with her husband with concerns about elevated BP. She was seen on 06/02/2024, when she reported right sided parietal headache after minor head injuries, head CT was ordered and is still pending.  -Hypertension: She is currently on no pharmacologic treatment.  She has a history of elevated blood pressure readings in the office, but her home readings are typically lower, around 120/70 mmHg, with a recent reading of 106/67 mmHg. She is concerned about occasional high readings, such as 150/96 mmHg and 133/93. She attributes some fluctuations to dietary factors, such as consuming [place] ham, which she notes is high in salt. She is under some stress due to high renovation and managing 2 households, one [place] and the one here in town. Negative for associated visual changes, CP, dyspnea, palpitation, focal neurologic deficit, or edema. She would like medication to take if needed when BP is elevated.  Headache has improved, it is no longer tender upon palpation. She mentions lower occipital headache, which she thinks is associated with neck pain/tension. No associated visual changes, limitation of range of motion, or radiation to upper extremities.  Since her last visit, she experienced episode of nausea and gastrointestinal discomfort, which she associates with consuming a particular yogurt. She feels nauseous but not to the point of vomiting and describes a sensation of 'revolution' in her stomach.  Ginger ale intake resulted in diarrhea and relief of symptoms. She suspects the yogurt may have been spoiled, as it appeared thicker than usual.   Symptoms have resolved. No associated fever, chills, body aches, or sick contact.  -She takes several supplements, including omega-3, vitamin E, vitamin D3, K2, and ashwagandha. She recently changed her omega-3 supplement and is considering adjusting her supplement regimen to identify any potential causes of her symptoms. She also takes hyaluronic acid in pill form and magnesium citrate.  Review of Systems  Constitutional:  Negative for activity change and appetite change.  HENT:  Negative for sore throat and trouble swallowing.   Respiratory:  Negative for cough and wheezing.   Endocrine: Negative for cold intolerance and heat intolerance.  Genitourinary:  Negative for decreased urine volume, dysuria and hematuria.  Skin:  Negative for rash.  Neurological:  Negative for syncope and facial asymmetry.  Psychiatric/Behavioral:  Negative for confusion and hallucinations. The patient is nervous/anxious.   See other pertinent positives and negatives in HPI.  Current Outpatient Medications on File Prior to Visit  Medication Sig Dispense Refill   AMBULATORY NON FORMULARY MEDICATION Colon Plus +fiber blend 1 scoop by mouth daily with water     Ascorbic Acid (VITAMIN C PO) Take 1 capsule by mouth daily.     COLLAGEN PO Take 1 capsule by mouth daily.     EPINEPHrine (EPIPEN JR) 0.15 MG/0.3ML injection      MAGNESIUM PO Take 1 capsule by mouth daily.     Omega-3 1000 MG CAPS Take by mouth.     Vitamin D -Vitamin K (DECARA K) 1250-200 MCG CAPS Take by mouth.     vitamin E 180 MG (400 UNITS) capsule Take 1 capsule (400 Units total) by mouth daily.     No current facility-administered medications on  file prior to visit.   Past Medical History:  Diagnosis Date   Allergy    Anxiety    Hyperlipemia    Osteopenia    Varicose veins of both lower extremities    Allergies  Allergen Reactions   Acetaminophen Anaphylaxis   Ciprofloxacin Anaphylaxis   Ambien [Zolpidem]    Aspirin    Penicillins      Social History   Socioeconomic History   Marital status: Married    Spouse name: Not on file   Number of children: 2   Years of education: Not on file   Highest education level: Not on file  Occupational History   Occupation: retired  Tobacco Use   Smoking status: Never    Passive exposure: Never   Smokeless tobacco: Never  Vaping Use   Vaping status: Never Used  Substance and Sexual Activity   Alcohol use: Never   Drug use: Never   Sexual activity: Not on file  Other Topics Concern   Not on file  Social History Narrative   Not on file   Social Drivers of Health   Financial Resource Strain: Low Risk  (01/21/2023)   Overall Financial Resource Strain (CARDIA)    Difficulty of Paying Living Expenses: Not hard at all  Food Insecurity: No Food Insecurity (01/21/2023)   Hunger Vital Sign    Worried About Running Out of Food in the Last Year: Never true    Ran Out of Food in the Last Year: Never true  Transportation Needs: No Transportation Needs (01/21/2023)   PRAPARE - Administrator, Civil Service (Medical): No    Lack of Transportation (Non-Medical): No  Physical Activity: Insufficiently Active (01/21/2023)   Exercise Vital Sign    Days of Exercise per Week: 3 days    Minutes of Exercise per Session: 10 min  Stress: No Stress Concern Present (01/21/2023)   [employer] of Occupational Health - Occupational Stress Questionnaire    Feeling of Stress : Not at all  Social Connections: Moderately Integrated (01/21/2023)   Social Connection and Isolation Panel    Frequency of Communication with Friends and Family: More than three times a week    Frequency of Social Gatherings with Friends and Family: Never    Attends Religious Services: 1 to 4 times per year    Active Member of Clubs or Organizations: No    Attends Banker Meetings: Never    Marital Status: Married   Vitals:   06/07/24 0932 06/07/24 1008  BP: (!) 150/90 (!) 138/90  Pulse:  60   Resp: 16   Temp: 97.9 F (36.6 C)   SpO2: 99%    Body mass index is 28.57 kg/m.  Physical Exam Vitals and nursing note reviewed.  Constitutional:      General: She is not in acute distress.    Appearance: She is well-developed.  HENT:     Head: Normocephalic and atraumatic.  Eyes:     Conjunctiva/sclera: Conjunctivae normal.  Cardiovascular:     Rate and Rhythm: Normal rate and regular rhythm.     Pulses:          Dorsalis pedis pulses are 2+ on the right side and 2+ on the left side.     Heart sounds: No murmur heard. Pulmonary:     Effort: Pulmonary effort is normal. No respiratory distress.     Breath sounds: Normal breath sounds.  Abdominal:     Palpations: Abdomen is soft. There is  no hepatomegaly or mass.     Tenderness: There is no abdominal tenderness.  Musculoskeletal:     Cervical back: Spasms and tenderness present. No bony tenderness. Normal range of motion.       Back:     Right lower leg: No edema.     Left lower leg: No edema.  Skin:    General: Skin is warm.     Findings: No erythema or rash.  Neurological:     General: No focal deficit present.     Mental Status: She is alert and oriented to person, place, and time.     Cranial Nerves: No cranial nerve deficit.     Gait: Gait normal.  Psychiatric:        Mood and Affect: Affect normal. Mood is anxious.    ASSESSMENT AND PLAN:  Bianca Sanchez was seen today for HTN and headache.  Headache, unspecified headache type Pending head CT ordered last visit (06/02/2024). Right parietal temporal headache she reported last visit that started after minor head trauma has resolved. Headache reported today, occipital, pressure-like, seems more tension headache. Chiropractic treatment of neck pain may help with headache. Instructed about warning signs.  Essential (primary) hypertension Assessment & Plan: Most BPs at home are adequate 120s/70s, occasionally 130s to 150s/90s with HR in the mid- high  60s. She would like medication to take if needed for hypertension, which seems to be exacerbated by some anxiety. We discussed options, recommend propranolol 10 mg twice daily as needed for BP 150/90 or higher x 2. We reviewed medication side effects. Continue low-salt diet. Continue monitoring BP regularly as well as HR.  Orders: -     Propranolol HCl; Take 1 tablet (10 mg total) by mouth 2 (two) times daily as needed (Blood pressure 150/90 or higher more than 2 times.).  Dispense: 30 tablet; Refill: 1  I personally spent a total of 34 minutes in the care of the patient today including preparing to see the patient, getting/reviewing separately obtained history, performing a medically appropriate exam/evaluation, counseling and educating, and documenting clinical information in the EHR.  Return if symptoms worsen or fail to improve, for keep next appointment.  Tomia Enlow G. [place], MD  [facility]. [place] [facility].

## 2024-06-07 NOTE — Telephone Encounter (Signed)
 Attempted to reach patient using language line interpreter, [phone]. Unable to reach patient. Left voicemail for patient to return my call.

## 2024-06-07 NOTE — Patient Instructions (Addendum)
 A few things to remember from today's visit:  Essential (primary) hypertension - Plan: propranolol (INDERAL) 10 MG tablet  Propranolol 10 mg si la presion arterial es 150/90 o mas en 2 ocasional separadas por 5-10 minutos.  Midase la presion e el pulso.

## 2024-06-07 NOTE — Assessment & Plan Note (Signed)
 Most BPs at home are adequate 120s/70s, occasionally 130s to 150s/90s with HR in the mid high 60s. She would like medication to take if needed for hypertension, which seems to be exacerbated for social anxiety. We discussed options, recommend propranolol 10 mg twice daily as needed for BP 150/90 or higher x 2. We discussed medication side effects. Continue monitoring BP regularly as well as HR.

## 2024-06-08 NOTE — Telephone Encounter (Signed)
 Patient seen in office 06/07/2024 by Dr. [place].

## 2024-07-02 ENCOUNTER — Other Ambulatory Visit: Payer: Self-pay | Admitting: Family Medicine

## 2024-07-02 DIAGNOSIS — I10 Essential (primary) hypertension: Secondary | ICD-10-CM

## 2024-08-03 ENCOUNTER — Ambulatory Visit: Payer: Self-pay

## 2024-08-03 NOTE — Telephone Encounter (Signed)
 FYI Only or Action Required?: FYI only for provider: appointment scheduled on 08/04/24.  Patient was last seen in primary care on 06/07/2024 by Jordan, Betty G, MD.  Called Nurse Triage reporting Jaw Pain.  Symptoms began several weeks ago.  Interventions attempted: Nothing.  Symptoms are: gradually worsening.  Triage Disposition: See PCP Within 2 Weeks  Patient/caregiver understands and will follow disposition?: Yes  Copied from CRM #[id number]. Topic: Clinical - Red Word Triage >> Aug 03, 2024 11:03 AM Tinnie BROCKS wrote: Red Word that prompted transfer to Nurse Triage: Pain from below jaw up to her ear x 2 weeks. Comes and goes.   Spanish interpretor Merrianne #[id number] Reason for Disposition  Intermittent pain or clicking sensation in jaw joint (i.e., TMJ joint is just in front of ear)  Answer Assessment - Initial Assessment Questions Spoke with pt using Spanish interpreter. Reports onset of intermittent right sided 3/10 needle-like jaw/ear pain 2 weeks ago with swollen lymph nodes on right side of neck. Worse when lying on right side of face and when touching jaw. Pain does not radiate to arm or shoulder. Denies CP, SOB or cardiac hx. Denies nausea or acid reflux. Denies clicking. Scheduled appt with PCP tomorrow.   1. ONSET: When did the pain start? (e.g., minutes, hours, days)     2 weeks ago  2. ONSET: Does the pain come and go, or has it been constant since it started? (e.g., constant, intermittent, fleeting)     Comes and goes  3. SEVERITY: How bad is the pain? (Scale 1-10; mild, moderate or severe)     3/10 needle-like pain, worse when lying on right side of face and when touching jaw  4. LOCATION: Where does it hurt?      Right jaw  5. RASH: Is there any redness, rash, or swelling of your face?     Denies  6. FEVER: Do you have a fever? If Yes, ask: What is it, how was it measured, and when did it start?      Denies  7. OTHER SYMPTOMS: Do you have any other  symptoms? (e.g., fever, toothache, nasal discharge, nasal congestion, clicking sensation in jaw joint)     Denies above.  Protocols used: Face Pain-A-AH

## 2024-08-04 ENCOUNTER — Ambulatory Visit: Admitting: Family Medicine

## 2024-08-04 ENCOUNTER — Encounter: Payer: Self-pay | Admitting: Family Medicine

## 2024-08-04 VITALS — BP 140/80 | HR 80 | Temp 97.9°F | Resp 16 | Ht 61.0 in | Wt 147.0 lb

## 2024-08-04 DIAGNOSIS — I10 Essential (primary) hypertension: Secondary | ICD-10-CM

## 2024-08-04 DIAGNOSIS — R6884 Jaw pain: Secondary | ICD-10-CM | POA: Diagnosis not present

## 2024-08-04 NOTE — Assessment & Plan Note (Signed)
 BP mildly elevated today, reporting lower BPs at home. She has propranolol  10 mg at home to take twice daily as needed. Continue low-salt diet. Continue monitoring BP regularly.

## 2024-08-04 NOTE — Progress Notes (Signed)
 ACUTE VISIT Chief Complaint  Patient presents with   Acute Visit    Jaw pain   Discussed the use of AI scribe software for clinical note transcription with the patient, who gave verbal consent to proceed.  History of Present Illness Bianca Sanchez is a 73 year old female with past medical history significant for hypertension and hyperlipidemia who presents with jaw pain.  She has been experiencing mild, intermittent jaw pain for the past two weeks, daily, mild. It is difficult to pint point but it seems to be located on the right side of TMJ, mandible, and occasionally extending behind her ear. The pain is bothersome, especially when lying on the affected side, but not severe. She noted the she has not had pain today. No hx of trauma. She has not identified alleviating factors. She has not taken medication for this problem.   No fever, chills, or abnormal weight loss. No pain when opening her mouth or chewing.  She has not noted lesions in her mouth, sore throat, difficulty swallowing, or stridor. Negative for earache, ear drainage, or changes in hearing. No recent URI. She came [place].  She is not aware of dental problems, last cleaning in 06/2024.  Hypertension: BP mildly elevated today.  She has been monitoring her blood pressure at home, which has been normal.  Last visit we agree on as needed medication, recommended propranolol  10 mg twice daily as needed to also help with anxiety.  She has not taken medication, she has not had any elevated BP or anxiety. Her blood pressure readings at home have been around  110's-120/70. Negative for unusual headache, CP, palpitation, dyspnea, focal neurologic deficit, or edema.  Review of Systems  Constitutional:  Negative for activity change, appetite change, chills, fatigue and fever.  HENT:  Negative for congestion, facial swelling, postnasal drip and rhinorrhea.   Eyes:  Negative for discharge and  redness.  Respiratory:  Negative for cough and wheezing.   Gastrointestinal:  Negative for abdominal pain, nausea and vomiting.  Skin:  Negative for rash.  Allergic/Immunologic: Positive for environmental allergies.  Neurological:  Negative for syncope and facial asymmetry.  See other pertinent positives and negatives in HPI.  Current Outpatient Medications on File Prior to Visit  Medication Sig Dispense Refill   AMBULATORY NON FORMULARY MEDICATION Colon Plus +fiber blend 1 scoop by mouth daily with water     Ascorbic Acid (VITAMIN C PO) Take 1 capsule by mouth daily.     COLLAGEN PO Take 1 capsule by mouth daily.     EPINEPHrine (EPIPEN JR) 0.15 MG/0.3ML injection      MAGNESIUM PO Take 1 capsule by mouth daily.     Omega-3 1000 MG CAPS Take by mouth.     propranolol  (INDERAL ) 10 MG tablet TAKE 1 TABLET BY MOUTH TWICE DAILY AS NEEDED( BLOOD PRESSURE 150/90 OR HIGHER MORE THAN 2 TIMES) 180 tablet 1   Vitamin D -Vitamin K (DECARA K) 1250-200 MCG CAPS Take by mouth.     vitamin E 180 MG (400 UNITS) capsule Take 1 capsule (400 Units total) by mouth daily.     No current facility-administered medications on file prior to visit.    Past Medical History:  Diagnosis Date   Allergy    Anxiety    Hyperlipemia    Osteopenia    Varicose veins of both lower extremities    Allergies  Allergen Reactions   Acetaminophen Anaphylaxis   Ciprofloxacin  Anaphylaxis   Ambien [Zolpidem]    Aspirin    Penicillins     Social History   Socioeconomic History   Marital status: Married    Spouse name: Not on file   Number of children: 2   Years of education: Not on file   Highest education level: Not on file  Occupational History   Occupation: retired  Tobacco Use   Smoking status: Never    Passive exposure: Never   Smokeless tobacco: Never  Vaping Use   Vaping status: Never Used  Substance and Sexual Activity   Alcohol use: Never   Drug use: Never   Sexual activity: Not on file  Other  Topics Concern   Not on file  Social History Narrative   Not on file   Social Drivers of Health   Financial Resource Strain: Low Risk  (01/21/2023)   Overall Financial Resource Strain (CARDIA)    Difficulty of Paying Living Expenses: Not hard at all  Food Insecurity: No Food Insecurity (01/21/2023)   Hunger Vital Sign    Worried About Running Out of Food in the Last Year: Never true    Ran Out of Food in the Last Year: Never true  Transportation Needs: No Transportation Needs (01/21/2023)   PRAPARE - Administrator, Civil Service (Medical): No    Lack of Transportation (Non-Medical): No  Physical Activity: Insufficiently Active (01/21/2023)   Exercise Vital Sign    Days of Exercise per Week: 3 days    Minutes of Exercise per Session: 10 min  Stress: No Stress Concern Present (01/21/2023)   [employer] of Occupational Health - Occupational Stress Questionnaire    Feeling of Stress : Not at all  Social Connections: Moderately Integrated (01/21/2023)   Social Connection and Isolation Panel    Frequency of Communication with Friends and Family: More than three times a week    Frequency of Social Gatherings with Friends and Family: Never    Attends Religious Services: 1 to 4 times per year    Active Member of Clubs or Organizations: No    Attends Banker Meetings: Never    Marital Status: Married   Vitals:   08/04/24 1556  BP: (!) 140/80  Pulse: 80  Resp: 16  Temp: 97.9 F (36.6 C)  SpO2: 97%   Body mass index is 27.78 kg/m.  Physical Exam Vitals and nursing note reviewed.  Constitutional:      General: She is not in acute distress.    Appearance: She is well-developed. She is not ill-appearing.  HENT:     Head: Normocephalic and atraumatic.     Jaw: No tenderness, swelling or pain on movement.     Salivary Glands: Right salivary gland is not diffusely enlarged or tender. Left salivary gland is not diffusely enlarged or tender.     Right Ear:  Tympanic membrane, ear canal and external ear normal.     Left Ear: Tympanic membrane, ear canal and external ear normal.     Nose: No congestion or rhinorrhea.     Right Sinus: No maxillary sinus tenderness or frontal sinus tenderness.     Left Sinus: No maxillary sinus tenderness or frontal sinus tenderness.     Mouth/Throat:     Mouth: Mucous membranes are moist.     Tongue: No lesions.     Palate: No mass.     Pharynx: Oropharynx is clear. No oropharyngeal exudate or posterior oropharyngeal erythema.  Eyes:  Conjunctiva/sclera: Conjunctivae normal.  Cardiovascular:     Rate and Rhythm: Normal rate and regular rhythm.     Heart sounds: No murmur heard. Pulmonary:     Effort: Pulmonary effort is normal. No respiratory distress.     Breath sounds: Normal breath sounds. No stridor.  Musculoskeletal:     Cervical back: No edema or erythema. No muscular tenderness.  Lymphadenopathy:     Head:     Right side of head: No submental, submandibular, preauricular or posterior auricular adenopathy.     Left side of head: No submandibular adenopathy.     Cervical: No cervical adenopathy.  Skin:    General: Skin is warm.     Findings: No erythema or rash.  Neurological:     Mental Status: She is alert and oriented to person, place, and time.  Psychiatric:        Mood and Affect: Mood normal.   ASSESSMENT AND PLAN:  Bianca Sanchez was seen today for acute visit.  Diagnoses and all orders for this visit:  Mandibular pain Not localized, it seems to be a round TMJ, lateral neck, and behind right ear. Problem started about 2 weeks ago, intermittent, daily and it seems resolved today. We discussed possible etiologies, including TMJ arthralgia, dental, musculoskeletal, and even eustachian tube dysfunction, among some. History and examination today do not suggest a serious process. We decided to hold on further workup for now but if pain reoccurs, we could arrange blood work (CBC  with diff and CRP).  If any mass or oral lesion, instructed to let me know. Recommend arranging an appointment with her dentist if pain becomes recurrent.  Essential (primary) hypertension Assessment & Plan: BP mildly elevated today, reporting lower BPs at home. She has propranolol  10 mg at home to take twice daily as needed. Continue low-salt diet. Continue monitoring BP regularly.  I personally spent a total of 33 minutes in the care of the patient today including preparing to see the patient, getting/reviewing separately obtained history, performing a medically appropriate exam/evaluation, counseling and educating, and documenting clinical information in the EHR.  Return if symptoms worsen or fail to improve, for keep next appointment.  Kyuss Hale G. Merel Santoli, MD  [facility]. [place] office.

## 2024-08-04 NOTE — Patient Instructions (Addendum)
 A few things to remember from today's visit:  Mandibular pain  Essential (primary) hypertension  El examen hoy fue negativo. Si le duele otra vez, podemos hacer examenes de [place]. Si alguna masa ma have saber. Si el dolor se vielve recurrente puede pedir una cita con su odontologo.  Do not use My Chart to request refills or for acute issues that need immediate attention. If you send a my chart message, it may take a few days to be addressed, specially if I am not in the office.  Please be sure medication list is accurate. If a new problem present, please set up appointment sooner than planned today.

## 2024-09-20 ENCOUNTER — Encounter: Admitting: Family Medicine

## 2024-10-06 ENCOUNTER — Encounter: Admitting: Family Medicine

## 2024-10-08 ENCOUNTER — Encounter: Payer: Self-pay | Admitting: Family Medicine

## 2024-10-08 ENCOUNTER — Ambulatory Visit: Payer: Self-pay | Admitting: Family Medicine

## 2024-10-08 ENCOUNTER — Ambulatory Visit: Admitting: Family Medicine

## 2024-10-08 VITALS — BP 138/80 | HR 66 | Temp 98.0°F | Resp 16 | Ht 61.0 in | Wt 148.6 lb

## 2024-10-08 DIAGNOSIS — E782 Mixed hyperlipidemia: Secondary | ICD-10-CM

## 2024-10-08 DIAGNOSIS — E538 Deficiency of other specified B group vitamins: Secondary | ICD-10-CM

## 2024-10-08 DIAGNOSIS — N281 Cyst of kidney, acquired: Secondary | ICD-10-CM

## 2024-10-08 DIAGNOSIS — E559 Vitamin D deficiency, unspecified: Secondary | ICD-10-CM

## 2024-10-08 DIAGNOSIS — Z0000 Encounter for general adult medical examination without abnormal findings: Secondary | ICD-10-CM | POA: Insufficient documentation

## 2024-10-08 LAB — VITAMIN B12: Vitamin B-12: >1500 pg/mL — ABNORMAL HIGH (ref 211–911)

## 2024-10-08 LAB — COMPREHENSIVE METABOLIC PANEL WITH GFR
ALT: 9 U/L (ref 3–35)
AST: 12 U/L (ref 5–37)
Albumin: 4.4 g/dL (ref 3.5–5.2)
Alkaline Phosphatase: 55 U/L (ref 39–117)
BUN: 19 mg/dL (ref 6–23)
CO2: 29 meq/L (ref 19–32)
Calcium: 9.4 mg/dL (ref 8.4–10.5)
Chloride: 103 meq/L (ref 96–112)
Creatinine, Ser: 0.83 mg/dL (ref 0.40–1.20)
GFR: 70.12 mL/min
Glucose, Bld: 91 mg/dL (ref 70–99)
Potassium: 4.5 meq/L (ref 3.5–5.1)
Sodium: 141 meq/L (ref 135–145)
Total Bilirubin: 0.6 mg/dL (ref 0.2–1.2)
Total Protein: 7 g/dL (ref 6.0–8.3)

## 2024-10-08 LAB — LIPID PANEL
Cholesterol: 201 mg/dL — ABNORMAL HIGH (ref 28–200)
HDL: 44.7 mg/dL
LDL Cholesterol: 131 mg/dL — ABNORMAL HIGH (ref 10–99)
NonHDL: 155.93
Total CHOL/HDL Ratio: 4
Triglycerides: 124 mg/dL (ref 10.0–149.0)
VLDL: 24.8 mg/dL (ref 0.0–40.0)

## 2024-10-08 LAB — VITAMIN D 25 HYDROXY (VIT D DEFICIENCY, FRACTURES): VITD: 68.2 ng/mL (ref 30.00–100.00)

## 2024-10-08 NOTE — Assessment & Plan Note (Signed)
We discussed the importance of regular physical activity and healthy diet for prevention of chronic illness and/or complications. Preventive guidelines reviewed. Vaccination up to date. Next CPE in a year. 

## 2024-10-08 NOTE — Assessment & Plan Note (Signed)
 Currently on B12 500 mcg daily. Further recommendation will be given according to B12 result.

## 2024-10-08 NOTE — Assessment & Plan Note (Signed)
 Currently on nonpharmacologic treatment. Further recommendation will be given according to lab results.

## 2024-10-08 NOTE — Patient Instructions (Addendum)
 A few things to remember from today's visit:  Routine general medical examination at a health care facility  Hyperlipidemia, mixed - Plan: Comprehensive metabolic panel with GFR, Lipid panel  Vitamin D  deficiency, unspecified - Plan: VITAMIN D  25 Hydroxy (Vit-D Deficiency, Fractures)  B12 deficiency - Plan: Vitamin B12  Renal cyst - Plan: US  Renal  No cambios hoy.

## 2024-10-08 NOTE — Progress Notes (Signed)
 "   Chief Complaint  Patient presents with   Annual Exam   Discussed the use of AI scribe software for clinical note transcription with the patient, who gave verbal consent to proceed. History of Present Illness Bianca Sanchez is a 74 year old female with past medical history significant for hyperlipidemia, hypertension, vitamin D  deficiency, vitamin B12 deficiency, and constipation here today with her husband for a routine follow-up visit. Bianca Sanchez was last seen on 08/04/2024, no new health issues since then.  In general Bianca Sanchez follows a healthful diet but has not done so recently due to travel to [place]. During her stay, Bianca Sanchez relied on local foods, including 'paradillita de langosta' and 'zurullitos', as Bianca Sanchez did not cook.  Bianca Sanchez maintains an active lifestyle, climbing stairs at home and managing her household.  Bianca Sanchez reports improved sleep but has not been able to consistently sleep eight hours since the pandemic. Typically, Bianca Sanchez sleeps four hours at a time, wakes up, and then falls back asleep. Bianca Sanchez uses natural remedies for sleep, including chamomile tea with anise, bay leaf, and clove, and applies Vicks VapoRub. No regular alcohol consumption. No history of tobacco use. Bianca Sanchez sees her dentist twice per year and her eye care provider annually.  Immunization History  Administered Date(s) Administered   PFIZER(Purple Top)SARS-COV-2 Vaccination 11/19/2019, 12/10/2019   Health Maintenance  Topic Date Due   COVID-19 Vaccine (4 - 2025-26 season) 10/24/2024 (Originally 05/03/2024)   Influenza Vaccine  11/30/2024 (Originally 04/02/2024)   Zoster Vaccines- Shingrix (1 of 2) 01/05/2025 (Originally 09/22/2001)   Pneumococcal Vaccine: 50+ Years (1 of 1 - PCV) 03/16/2025 (Originally 09/22/2001)   Medicare Annual Wellness (AWV)  03/16/2025   Mammogram  04/19/2026   Colonoscopy  02/26/2033   Bone Density Scan  Completed   Hepatitis C Screening  Completed   Meningococcal B Vaccine  Aged Out   DTaP/Tdap/Td   Discontinued   Hyperlipidemia: Currently Bianca Sanchez is not on pharmacologic treatment. Lab Results  Component Value Date   CHOL 178 03/23/2024   HDL 45.00 03/23/2024   LDLCALC 112 (H) 03/23/2024   TRIG 105.0 03/23/2024   CHOLHDL 4 03/23/2024   B12 deficiency: Currently Bianca Sanchez is on B12 500 mcg daily. Lab Results  Component Value Date   VITAMINB12 542 03/23/2024   Vitamin D  deficiency: Supplementation will discontinue after elevated 25 OH vitamin D .  Lab Results  Component Value Date   VD25OH 105.14 (HH) 03/23/2024   Lab Results  Component Value Date   NA 140 03/23/2024   CL 103 03/23/2024   K 4.6 03/23/2024   CO2 30 03/23/2024   BUN 14 03/23/2024   CREATININE 0.82 03/23/2024   GFR 71.42 03/23/2024   CALCIUM 9.4 03/23/2024   ALBUMIN 4.5 09/25/2023   GLUCOSE 91 03/23/2024   Lab Results  Component Value Date   ALT 14 09/25/2023   AST 17 09/25/2023   ALKPHOS 46 09/25/2023   BILITOT 0.5 09/25/2023   Lab Results  Component Value Date   HGBA1C 5.1 06/18/2023   History of renal cyst, seen on renal US  in 01/2021, left-sided simple cyst; and in 02/2023, which revealed a 3.2 cm left renal cyst and a 1.2 cm right renal cyst. Bianca Sanchez has not noted gross hematuria.  Review of Systems  Constitutional:  Negative for activity change, appetite change, chills and fever.  HENT:  Negative for mouth sores, sore throat and trouble swallowing.   Eyes:  Negative for redness and visual disturbance.  Respiratory:  Negative for cough,  shortness of breath and wheezing.   Cardiovascular:  Negative for chest pain and leg swelling.  Gastrointestinal:  Negative for abdominal pain, nausea and vomiting.  Endocrine: Negative for cold intolerance, heat intolerance, polydipsia, polyphagia and polyuria.  Genitourinary:  Negative for decreased urine volume, dysuria and hematuria.  Musculoskeletal:  Negative for gait problem and myalgias.  Skin:  Negative for color change and rash.  Allergic/Immunologic: Negative  for environmental allergies.  Neurological:  Negative for syncope, weakness and headaches.  Hematological:  Negative for adenopathy. Does not bruise/bleed easily.  Psychiatric/Behavioral:  Negative for confusion and hallucinations.   All other systems reviewed and are negative.  Medications Ordered Prior to Encounter[1]  Past Medical History:  Diagnosis Date   Allergy    Anxiety    Hyperlipemia    Osteopenia    Varicose veins of both lower extremities    Past Surgical History:  Procedure Laterality Date   INGUINAL HERNIA REPAIR  1980   Allergies[2]  Family History  Problem Relation Age of Onset   Breast cancer Neg Hx    Colon cancer Neg Hx    Esophageal cancer Neg Hx    Stomach cancer Neg Hx    Rectal cancer Neg Hx     Social History   Socioeconomic History   Marital status: Married    Spouse name: Not on file   Number of children: 2   Years of education: Not on file   Highest education level: Not on file  Occupational History   Occupation: retired  Tobacco Use   Smoking status: Never    Passive exposure: Never   Smokeless tobacco: Never  Vaping Use   Vaping status: Never Used  Substance and Sexual Activity   Alcohol use: Never   Drug use: Never   Sexual activity: Not on file  Other Topics Concern   Not on file  Social History Narrative   Not on file   Social Drivers of Health   Tobacco Use: Low Risk (10/08/2024)   Patient History    Smoking Tobacco Use: Never    Smokeless Tobacco Use: Never    Passive Exposure: Never  Financial Resource Strain: Low Risk (01/21/2023)   Overall Financial Resource Strain (CARDIA)    Difficulty of Paying Living Expenses: Not hard at all  Food Insecurity: No Food Insecurity (01/21/2023)   Hunger Vital Sign    Worried About Running Out of Food in the Last Year: Never true    Ran Out of Food in the Last Year: Never true  Transportation Needs: No Transportation Needs (01/21/2023)   PRAPARE - Scientist, Research (physical Sciences) (Medical): No    Lack of Transportation (Non-Medical): No  Physical Activity: Insufficiently Active (01/21/2023)   Exercise Vital Sign    Days of Exercise per Week: 3 days    Minutes of Exercise per Session: 10 min  Stress: No Stress Concern Present (01/21/2023)   [employer] of Occupational Health - Occupational Stress Questionnaire    Feeling of Stress : Not at all  Social Connections: Moderately Integrated (01/21/2023)   Social Connection and Isolation Panel    Frequency of Communication with Friends and Family: More than three times a week    Frequency of Social Gatherings with Friends and Family: Never    Attends Religious Services: 1 to 4 times per year    Active Member of [employer] or Organizations: No    Attends Banker Meetings: Never    Marital Status: Married  Depression (PHQ2-9): Low Risk (06/02/2024)   Depression (PHQ2-9)    PHQ-2 Score: 0  Alcohol Screen: Low Risk (01/21/2023)   Alcohol Screen    Last Alcohol Screening Score (AUDIT): 0  Housing: Low Risk (01/21/2023)   Housing    Last Housing Risk Score: 0  Utilities: Not At Risk (01/21/2023)   AHC Utilities    Threatened with loss of utilities: No  Health Literacy: Not on file   Vitals:   10/08/24 0831  BP: 138/80  Pulse: 66  Resp: 16  Temp: 98 F (36.7 C)  SpO2: 94%   Body mass index is 28.08 kg/m.  Wt Readings from Last 3 Encounters:  10/08/24 148 lb 9.6 oz (67.4 kg)  08/04/24 147 lb (66.7 kg)  06/07/24 151 lb 3.2 oz (68.6 kg)   Physical Exam Vitals and nursing note reviewed.  Constitutional:      General: Bianca Sanchez is not in acute distress.    Appearance: Bianca Sanchez is well-developed.  HENT:     Head: Normocephalic and atraumatic.     Right Ear: Hearing, tympanic membrane, ear canal and external ear normal.     Left Ear: Hearing, tympanic membrane, ear canal and external ear normal.     Mouth/Throat:     Mouth: Mucous membranes are moist.     Pharynx: Oropharynx is clear. Uvula  midline.  Eyes:     Extraocular Movements: Extraocular movements intact.     Conjunctiva/sclera: Conjunctivae normal.     Pupils: Pupils are equal, round, and reactive to light.  Neck:     Thyroid : No thyromegaly.     Trachea: No tracheal deviation.  Cardiovascular:     Rate and Rhythm: Normal rate and regular rhythm.     Pulses:          Dorsalis pedis pulses are 2+ on the right side and 2+ on the left side.     Heart sounds: No murmur heard. Pulmonary:     Effort: Pulmonary effort is normal. No respiratory distress.     Breath sounds: Normal breath sounds.  Abdominal:     Palpations: Abdomen is soft. There is no hepatomegaly or mass.     Tenderness: There is no abdominal tenderness.  Genitourinary:    Comments: No concerns today. Musculoskeletal:     Comments: No signs of synovitis appreciated.  Lymphadenopathy:     Cervical: No cervical adenopathy.     Upper Body:     Right upper body: No supraclavicular adenopathy.     Left upper body: No supraclavicular adenopathy.  Skin:    General: Skin is warm.     Findings: No erythema or rash.  Neurological:     General: No focal deficit present.     Mental Status: Bianca Sanchez is alert and oriented to person, place, and time.     Cranial Nerves: No cranial nerve deficit.     Coordination: Coordination normal.     Gait: Gait normal.     Deep Tendon Reflexes:     Reflex Scores:      Bicep reflexes are 2+ on the right side and 2+ on the left side.      Patellar reflexes are 2+ on the right side and 2+ on the left side. Psychiatric:        Mood and Affect: Mood and affect normal.   ASSESSMENT AND PLAN: Bianca Sanchez was here today annual physical examination.  Orders Placed This Encounter  Procedures   US  Renal  Comprehensive metabolic panel with GFR   Lipid panel   VITAMIN D  25 Hydroxy (Vit-D Deficiency, Fractures)   Vitamin B12   Lab Results  Component Value Date   VITAMINB12 >1500 (H) 10/08/2024   Lab Results   Component Value Date   VD25OH 68.20 10/08/2024   Lab Results  Component Value Date   NA 141 10/08/2024   CL 103 10/08/2024   K 4.5 10/08/2024   CO2 29 10/08/2024   BUN 19 10/08/2024   CREATININE 0.83 10/08/2024   GFR 70.12 10/08/2024   CALCIUM 9.4 10/08/2024   ALBUMIN 4.4 10/08/2024   GLUCOSE 91 10/08/2024   Lab Results  Component Value Date   ALT 9 10/08/2024   AST 12 10/08/2024   ALKPHOS 55 10/08/2024   BILITOT 0.6 10/08/2024   Routine general medical examination at a health care facility Assessment & Plan: We discussed the importance of regular physical activity and healthy diet for prevention of chronic illness and/or complications. Preventive guidelines reviewed. Vaccination up to date. Next CPE in a year.   Hyperlipidemia, mixed Assessment & Plan: Currently on nonpharmacologic treatment. Further recommendation will be given according to lab results.  Orders: -     Comprehensive metabolic panel with GFR; Future -     Lipid panel; Future  Vitamin D  deficiency, unspecified Assessment & Plan: Currently Bianca Sanchez is not on vit D supplementation, discontinued due to elevated 25 OH vit D. Further recommendations according to lab results.   Orders: -     VITAMIN D  25 Hydroxy (Vit-D Deficiency, Fractures); Future  B12 deficiency Assessment & Plan: Currently on B12 500 mcg daily. Further recommendation will be given according to B12 result.  Orders: -     Vitamin B12; Future  Renal cyst Assessment & Plan: US  on 02/11/2023 showed a 3.2 cm left renal cyst and a 1.2 cm right renal cyst.  07/31/2021: Left-sided simple cyst. Most likely benign, will arrange a renal US  for follow up and if stable, no further follow up will be recommended.  Orders: -     US  RENAL; Future   Return in 1 year (on 10/08/2025) for CPE, chronic problems.  Sharyl Panchal G. Keilin Gamboa, MD  [facility]. [place] office.     [1]  Current Outpatient Medications on File Prior to Visit   Medication Sig Dispense Refill   AMBULATORY NON FORMULARY MEDICATION Colon Plus +fiber blend 1 scoop by mouth daily with water (Patient taking differently: Colon Plus +fiber blend 1 scoop by mouth daily with water  As needed)     Ascorbic Acid (VITAMIN C PO) Take 1 capsule by mouth daily.     COLLAGEN PO Take 1 capsule by mouth daily.     EPINEPHrine (EPIPEN JR) 0.15 MG/0.3ML injection      MAGNESIUM PO Take 1 capsule by mouth daily.     Omega-3 1000 MG CAPS Take by mouth.     propranolol  (INDERAL ) 10 MG tablet TAKE 1 TABLET BY MOUTH TWICE DAILY AS NEEDED( BLOOD PRESSURE 150/90 OR HIGHER MORE THAN 2 TIMES) (Patient taking differently: As needed) 180 tablet 1   vitamin E 180 MG (400 UNITS) capsule Take 1 capsule (400 Units total) by mouth daily.     No current facility-administered medications on file prior to visit.  [2]  Allergies Allergen Reactions   Acetaminophen Anaphylaxis   Ciprofloxacin Anaphylaxis   Ambien [Zolpidem]    Aspirin    Penicillins    "

## 2024-10-08 NOTE — Assessment & Plan Note (Addendum)
 US  on 02/11/2023 showed a 3.2 cm left renal cyst and a 1.2 cm right renal cyst.  07/31/2021: Left-sided simple cyst. Most likely benign, will arrange a renal US  for follow up and if stable, no further follow up will be recommended.

## 2024-10-08 NOTE — Assessment & Plan Note (Signed)
 Currently she is not on vit D supplementation, discontinued due to elevated 25 OH vit D. Further recommendations according to lab results.

## 2024-10-19 ENCOUNTER — Other Ambulatory Visit
